# Patient Record
Sex: Female | Born: 1980 | Race: White | Hispanic: No | Marital: Married | Smoking: Never smoker
Health system: Southern US, Community
[De-identification: ages and names within clinical notes are randomized; demographics above are authoritative.]

## PROBLEM LIST (undated history)

## (undated) ENCOUNTER — Inpatient Hospital Stay (HOSPITAL_COMMUNITY): Payer: Self-pay

## (undated) DIAGNOSIS — R011 Cardiac murmur, unspecified: Secondary | ICD-10-CM

## (undated) DIAGNOSIS — T7840XA Allergy, unspecified, initial encounter: Secondary | ICD-10-CM

## (undated) DIAGNOSIS — R079 Chest pain, unspecified: Secondary | ICD-10-CM

## (undated) HISTORY — DX: Allergy, unspecified, initial encounter: T78.40XA

## (undated) HISTORY — PX: OTHER SURGICAL HISTORY: SHX169

## (undated) HISTORY — DX: Chest pain, unspecified: R07.9

## (undated) HISTORY — DX: Cardiac murmur, unspecified: R01.1

---

## 2009-07-06 ENCOUNTER — Ambulatory Visit: Payer: Self-pay | Admitting: Family Medicine

## 2009-07-06 DIAGNOSIS — I499 Cardiac arrhythmia, unspecified: Secondary | ICD-10-CM | POA: Insufficient documentation

## 2009-07-07 ENCOUNTER — Encounter: Payer: Self-pay | Admitting: Family Medicine

## 2009-07-07 ENCOUNTER — Telehealth (INDEPENDENT_AMBULATORY_CARE_PROVIDER_SITE_OTHER): Payer: Self-pay

## 2009-07-11 LAB — CONVERTED CEMR LAB
Albumin: 4.6 g/dL (ref 3.5–5.2)
BUN: 10 mg/dL (ref 6–23)
Basophils Relative: 0 % (ref 0–1)
CO2: 22 meq/L (ref 19–32)
Creatinine, Ser: 0.77 mg/dL (ref 0.40–1.20)
Eosinophils Absolute: 0 10*3/uL (ref 0.0–0.7)
HCT: 40.7 % (ref 36.0–46.0)
Hemoglobin: 13.9 g/dL (ref 12.0–15.0)
Lymphocytes Relative: 23 % (ref 12–46)
Lymphs Abs: 2.1 10*3/uL (ref 0.7–4.0)
Potassium: 3.7 meq/L (ref 3.5–5.3)
RBC: 4.77 M/uL (ref 3.87–5.11)
TSH: 2.193 microintl units/mL (ref 0.350–4.500)
Total Bilirubin: 0.4 mg/dL (ref 0.3–1.2)

## 2009-07-12 ENCOUNTER — Ambulatory Visit: Payer: Self-pay | Admitting: Obstetrics & Gynecology

## 2009-07-12 ENCOUNTER — Ambulatory Visit: Payer: Self-pay | Admitting: Family Medicine

## 2009-08-01 ENCOUNTER — Ambulatory Visit: Payer: Self-pay | Admitting: Family Medicine

## 2009-08-01 DIAGNOSIS — R002 Palpitations: Secondary | ICD-10-CM

## 2009-08-01 DIAGNOSIS — J209 Acute bronchitis, unspecified: Secondary | ICD-10-CM

## 2009-08-10 ENCOUNTER — Encounter: Payer: Self-pay | Admitting: Cardiology

## 2009-08-10 ENCOUNTER — Ambulatory Visit: Payer: Self-pay | Admitting: Cardiology

## 2009-08-10 DIAGNOSIS — R011 Cardiac murmur, unspecified: Secondary | ICD-10-CM

## 2009-08-10 DIAGNOSIS — R079 Chest pain, unspecified: Secondary | ICD-10-CM

## 2009-08-10 HISTORY — DX: Cardiac murmur, unspecified: R01.1

## 2009-08-10 HISTORY — DX: Chest pain, unspecified: R07.9

## 2009-12-14 ENCOUNTER — Ambulatory Visit: Payer: Self-pay | Admitting: Family Medicine

## 2010-05-16 NOTE — Assessment & Plan Note (Signed)
Summary: CHECK BP/TJ  Nurse Visit Pt states she just wanted to have her BP checked. Pt was advsd she would need to establish with PCP to follow her blood pressure.  Vital Signs:  Patient profile:   30 year old female Temp:     99.0 degrees F Pulse rate:   87 / minute Pulse rhythm:   regular BP sitting:   134 / 82  (right arm) Cuff size:   regular  Vitals Entered By: Lajean Saver RN (July 12, 2009 5:33 PM)  Allergies: 1)  ! * Dust

## 2010-05-16 NOTE — Assessment & Plan Note (Signed)
Summary: NOV palpitations/ bronchitis   Vital Signs:  Patient profile:   30 year old female Menstrual status:  regular LMP:     06/14/2009 Height:      69 inches Weight:      155 pounds BMI:     22.97 O2 Sat:      100 % on Room air Temp:     98.2 degrees F oral Pulse rate:   90 / minute BP sitting:   130 / 82  (left arm) Cuff size:   regular  Vitals Entered By: Payton Spark CMA (August 01, 2009 9:44 AM)  O2 Flow:  Room air CC: New to est. C/o heart flutters. Had normal workup at [facility] but GYN heard murmur and referred to Cards.  LMP (date): 06/14/2009     Menstrual Status regular Enter LMP: 06/14/2009   Primary Care Provider:  Seymour Bars DO  CC:  New to est. C/o heart flutters. Had normal workup at [facility] but GYN heard murmur and referred to Cards. .  History of Present Illness: 30 yo WF presents for NOV.  Previously seeen at [facility].  She sees Dr Marice Potter for gyn care.  She started having heart palpitations 07-06-2009. This started suddenly.  She began having palpitations for about an hour on and off.  Never had this before.  Not associated with chest pain, SOB at the time.   She went to [facility] and was sent home with  Klonopin RX.  EKG/ labs and CXR were all normal.  She has been having cold symptoms for about 10 days, now with chest pressure.  Her husband has PNA.  Denies sputum production.      Her flutters have reduced in frequency.  She is taking Sudafed.  She rarely drinks caffeine.  She feels like she has not been under much stress.  She thinks that the Klonopin has not helped and that her palpitations started before she started the sudafed.  She has not had a fever.        Current Medications (verified): 1)  Jolessa 0.15-0.03 Mg Tabs (Levonorgest-Eth Estrad 91-Day) .... Take 1 Tab By Mouth Once Daily 2)  Klonopin 0.5 Mg Tabs (Clonazepam) .... Take 1/2 To 1 Tablet By Mouth Twice A Day As Needed For Palpataions or Anxiety 3)  Zyrtec Allergy 10 Mg Tabs (Cetirizine Hcl) .... Take  1 Tab By Mouth Once Daily 4)  Multivitamins  Tabs (Multiple Vitamin) .... Take 1 Tab By Mouth Once Daily  Allergies (verified): 1)  ! 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  Review of Systems       no fevers/sweats/weakness, unexplained wt loss/gain, no change in vision, no difficulty hearing, ringing in ears, + hay fever/allergies, + CP/discomfort, no palpitations, no breast lump/nipple discharge, + cough/wheeze, no blood in stool, no N/V/D, no nocturia, no leaking urine, no unusual vag bleeding, no vaginal/penile discharge, no muscle/joint pain, no rash, no new/changing mole, no HA, no memory loss, no anxiety, no sleep problem, no depression, no unexplained lumps, no easy bruising/bleeding   Physical Exam  General:  alert, well-developed, well-nourished, and well-hydrated.   Head:  normocephalic and atraumatic.   Eyes:  conjunctiva clear Nose:  no nasal discharge.   Mouth:  good dentition and pharynx pink and moist.  throat mildly injected  Neck:  no masses.   Lungs:  Normal respiratory effort, chest expands symmetrically. Lungs are clear to auscultation, no crackles or wheezes. Heart:  Normal rate and regular rhythm. S1 and S2 normal without gallop, +1/6 vibratory flow murmur over the LLSB, no  click, rub or other extra sounds. Abdomen:  soft, non-tender, normal bowel sounds, no distention, no masses, no guarding, no hepatomegaly, and no splenomegaly.   Extremities:  no LE edema Skin:  color normal.   Cervical Nodes:  No lymphadenopathy noted Psych:  flat affect.     Impression & Recommendations:  Problem # 1:  PALPITATIONS (ICD-785.1) Reviewed UC w/u including labs, EKG and CXR = all normal. Her palpitations have decreased in frequency, maybe due to use of Klonopin. Normal exam  findings, BP, HR today.   She can keep her appt with cardiology to follow up. For now, she is to STOP any caffeine and all sudafed use.    Problem # 2:  ACUTE BRONCHITIS (ICD-466.0) Will go ahead and treat her given her symptoms of chest tightness and expsosure to husband who has PNA at home.  Take 5 days of Zithromax with PLAIN Mucinex.   Her updated medication list for this problem includes:    Zithromax Z-pak 250 Mg Tabs (Azithromycin) .Marland Kitchen... 2 tabs by mouth x 1 day then 1 tab by mouth daily x 4 days    Mucinex 600 Mg Xr12h-tab (Guaifenesin) .Marland Kitchen... 1 tab by mouth q 12 hrs  Complete Medication List: 1)  Jolessa 0.15-0.03 Mg Tabs (Levonorgest-eth estrad 91-day) .... Take 1 tab by mouth once daily 2)  Klonopin 0.5 Mg Tabs (Clonazepam) .... Take 1/2 to 1 tablet by mouth twice a day as needed for palpataions or anxiety 3)  Zyrtec Allergy 10 Mg Tabs (Cetirizine hcl) .... Take 1 tab by mouth once daily 4)  Multivitamins Tabs (Multiple vitamin) .... Take 1 tab by mouth once daily 5)  Zithromax Z-pak 250 Mg Tabs (Azithromycin) .... 2 tabs by mouth x 1 day then 1 tab by mouth daily x 4 days 6)  Mucinex 600 Mg Xr12h-tab (Guaifenesin) .Marland Kitchen.. 1 tab by mouth q 12 hrs  Patient Instructions: 1)  Labs / CXR/ EKG reviewed.  All normal from UC. 2)  Start Zithromax for bronchitis.  Take for 5 days. 3)  Avoid CAFFEINE AND SUDAFED. 4)  Use Klonopin as needed for anxiety. 5)  F/U palpitations with Dr Jens Som. Prescriptions: ZITHROMAX Z-PAK 250 MG TABS (AZITHROMYCIN) 2 tabs by mouth x 1 day then 1 tab by mouth daily x 4 days  #1 pack x 0   Entered and Authorized by:   Seymour Bars DO   Signed by:   Seymour Bars DO on 08/01/2009   Method used:   Electronically to        [facility] [address] [phone]* (retail)       [address]       [place], [place]  [address]       Ph: [phone] or [phone]       Fax: [phone]   RxID:   [id number]

## 2010-05-16 NOTE — Assessment & Plan Note (Signed)
Summary: HEART FLUTTERING   Vital Signs:  Patient Profile:   30 Years Old Female CC:      Weird fluttering in chest x 3 days Height:     69 inches Weight:      160 pounds O2 Sat:      100 % O2 treatment:    Room Air Temp:     99.0 degrees F oral Pulse rate:   90 / minute Pulse rhythm:   regular Resp:     12 per minute BP sitting:   133 / 81  (right arm) Cuff size:   regular  Vitals Entered By: Emilio Math (July 06, 2009 4:02 PM)                  Prior Medication List:  No prior medications documented  Current Allergies (reviewed today): ! * DUSTHistory of Present Illness Chief Complaint: Weird fluttering in chest x 3 days History of Present Illness: Patient has had an unusual fluttering ssensation in her chest for 3 days. 2 YEARS AGO HAD CHEST PAIN AND WAS TREATED FOR AN ANXIETY ATTACK BUT THE PAIN IN HER CHEST WAS DIFFERENT THAN THIS DISCOMFORT.  sHE DID TAKE A MUSCLE RELAXER 0N MONDAY. IT DID GO AWAY FOR A WHILE BUTB LAST NIGHT SHE NOTICED IT AND TODAY EVEN WHEN SHE IS NOT STILL.  it IS NOT CONSTANT AND IT COMES AND GOES. SHE DENIES HIGH CAFINE DOSAGES.   Current Problems: ANXIETY (ICD-300.00) UNSPECIFIED CARDIAC DYSRHYTHMIA (ICD-427.9)   Current Meds JOLESSA 0.15-0.03 MG TABS (LEVONORGEST-ETH ESTRAD 91-DAY)  KLONOPIN 0.5 MG TABS (CLONAZEPAM) sig 1/2 to 1 tablet by mouth twice a day as needed for palpataions or anxiety  REVIEW OF SYSTEMS Constitutional Symptoms      Denies fever, chills, night sweats, weight loss, weight gain, and fatigue.  Eyes       Denies change in vision, eye pain, eye discharge, glasses, contact lenses, and eye surgery. Ear/Nose/Throat/Mouth       Denies hearing loss/aids, change in hearing, ear pain, ear discharge, dizziness, frequent runny nose, frequent nose bleeds, sinus problems, sore throat, hoarseness, and tooth pain or bleeding.  Respiratory       Denies dry cough, productive cough, wheezing, shortness of breath, asthma, bronchitis,  and emphysema/COPD.  Cardiovascular       Denies murmurs, chest pain, and tires easily with exhertion.      Comments: NO CHEST P[AIN   Gastrointestinal       Denies stomach pain, nausea/vomiting, diarrhea, constipation, blood in bowel movements, and indigestion. Genitourniary       Denies painful urination, kidney stones, and loss of urinary control. Neurological       Denies paralysis, seizures, and fainting/blackouts. Musculoskeletal       Denies muscle pain, joint pain, joint stiffness, decreased range of motion, redness, swelling, muscle weakness, and gout.  Skin       Denies bruising, unusual mles/lumps or sores, and hair/skin or nail changes.  Psych       Denies mood changes, temper/anger issues, anxiety/stress, speech problems, depression, and sleep problems. Other Comments: Irregular heart beat x 3 days SHE DECRIBES AS SKIPPING BEATS .   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 Mother, Healthy father, Healthy Brother,  Irregular heart beat  Social History: Reviewed history and no changes required. 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 Physical Exam General appearance: well developed, well nourished, no acute distress Head: normocephalic, atraumatic Oral/Pharynx: tongue normal, posterior pharynx without erythema or exudate Chest/Lungs: no rales, wheezes, or rhonchi bilateral, breath sounds equal without effort Heart: regular rate and  rhythm, W.woccasional skipped beatsno murmur Abdomen: soft, non-tender without obvious organomegaly Skin: no obvious rashes or lesions MSE: oriented to time, place, and person Assessment Problems:   New  Problems: ANXIETY (ICD-300.00) UNSPECIFIED CARDIAC DYSRHYTHMIA (ICD-427.9)  anxiety or arrthymia  Plan New Medications/Changes: KLONOPIN 0.5 MG TABS (CLONAZEPAM) sig 1/2 to 1 tablet by mouth twice a day as needed for palpataions or anxiety  #20 x 0, 07/06/2009, Hassan Rowan MD  New Orders: T-Chest x-ray, 2 views [71020] T-CBC w/Diff [[id number]] T-Comprehensive Metabolic Panel [[id number]] T-TSH [[id number]] T-D-Dimer Fibrin Derivatives Quantitive [[id number]] New Patient Level IV [28413] Planning Comments:   need to establish a PCP  Follow Up: Follow up on an as needed basis, Follow up with Primary Physician  The patient and/or caregiver has been counseled thoroughly with regard to medications prescribed including dosage, schedule, interactions, rationale for use, and possible side effects and they verbalize understanding.  Diagnoses and expected course of recovery discussed and will return if not improved as expected or if the condition worsens. Patient and/or caregiver verbalized understanding.  Prescriptions: KLONOPIN 0.5 MG TABS (CLONAZEPAM) sig 1/2 to 1 tablet by mouth twice a day as needed for palpataions or anxiety  #20 x 0   Entered and Authorized by:   Hassan Rowan MD   Signed by:   Hassan Rowan MD on 07/06/2009   Method used:   Print then Give to Patient   RxID:   [id number]   Patient Instructions: 1)  Use the klonopin only if feeling palpatations or anxiety 2)  As discussed option of seeing Cardiologist if things get worse for echo, stress test or holtermonitoir 3)  Please schedule a follow-up appointment as needed. 4)  Please schedule an appointment with your primary doctor in :3-5 days if not better  5)  estabish a PCP

## 2010-05-16 NOTE — Assessment & Plan Note (Signed)
Summary: Tb Skin Test - jr  Nurse Visit   Vitals Entered By: Payton Spark CMA (December 14, 2009 4:09 PM)  Allergies: 1)  ! * Dust  Immunizations Administered:  PPD Skin Test:    Vaccine Type: PPD    Site: left forearm    Dose: 0.1 ml    Route: ID    Given by: Payton Spark CMA  Orders Added: 1)  TB Skin Test [86580] 2)  Admin 1st Vaccine [16109]

## 2010-05-16 NOTE — Assessment & Plan Note (Signed)
Summary: [facility]   Visit Type:  Initial Consult Primary Provider:  Seymour Bars DO  CC:  palpitations.  History of Present Illness: 30 year old female for evaluation of palpitations. No prior cardiac history. Note TSH normal in March of 2011. Nprior cardiac history. She typically does not have dyspnea on exertion, orthopnea, PND, pedal edema, syncope or exertional chest pain. Over the past month she has had occasional palpitations. They're described as a skipp and flutter. There is no associated symptoms. They're not related to exertion. She was seen by her primary care physician and she was told to discontinue her Sudafed and her symptoms have improve. She also recently had bronchitis and had chest tightness during that period. It increased with lying flat. There is no associated nausea, short of breath or diaphoresis. She was treated and her symptoms have now resolved. Because of the above we were asked to further evaluate. She was also noted to have a murmur.  Current Medications (verified): 1)  Jolessa 0.15-0.03 Mg Tabs (Levonorgest-Eth Estrad 91-Day) .... Take 1 Tab By Mouth Once Daily 2)  Zyrtec Allergy 10 Mg Tabs (Cetirizine Hcl) .... Take 1 Tab By Mouth Once Daily 3)  Multivitamins  Tabs (Multiple Vitamin) .... Take 1 Tab By Mouth Once Daily  Allergies: 1)  ! * Dust  Past History:  Past Medical History: No significant PMH  Past Surgical History: Reviewed history from 08/01/2009 and no changes required. wisdom teeth removal  Family History: Reviewed history from 07/06/2009 and no changes required. Mother, Healthy father, Healthy Brother, Irregular heart beat  Social History: Reviewed history from 08/01/2009 and no changes required. 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  No kids.  Review of Systems       Recent bronchitis now improved but no fevers or chills, productive cough, hemoptysis, dysphasia, odynophagia,  melena, hematochezia, dysuria, hematuria, rash, seizure activity, orthopnea, PND, pedal edema, claudication. Remaining systems are negative.   Vital Signs:  Patient profile:   30 year old female Menstrual status:  regular Height:      69 inches Weight:      156.75 pounds BMI:     23.23 Pulse rate:   82 / minute Pulse rhythm:   regular Resp:     18 per minute BP sitting:   122 / 70  (left arm) Cuff size:   large  Vitals Entered By: Vikki Ports (August 10, 2009 10:31 AM)  Physical Exam  General:  Well developed/well nourished in NAD Skin warm/dry Patient not depressed No peripheral clubbing Back-normal HEENT-normal/normal eyelids Neck supple/normal carotid upstroke bilaterally; no bruits; no JVD; no thyromegaly chest - CTA/ normal expansion CV - RRR/normal S1 and S2; no  rubs or gallops;  PMI nondisplaced, 1-2/6 systolic ejection murmur. Abdomen -NT/ND, no HSM, no mass, + bowel sounds, no bruit 2+ femoral pulses, no bruits Ext-no edema, chords, 2+ DP Neuro-grossly nonfocal     EKG  Procedure date:  08/10/2009  Findings:      Normal sinus rhythm at a rate of 82. Axis normal. No ST changes. No delta wave noted.  Impression & Recommendations:  Problem # 1:  PALPITATIONS (ICD-785.1) Symptoms sound to be PVCs. They have improved now that Sudafed has been discontinued. I will not pursue this further unless they return. If so we could then consider a CardioNet monitor. Orders: Echocardiogram (Echo)  Problem # 2:  CHEST PAIN (ICD-786.50) Symptoms most likely related to bronchitis. No exertional chest pain and electrocardiogram  normal. No further evaluation.  Problem # 3:  CARDIAC MURMUR (ICD-785.2) Probable ejection murmur. Will schedule echocardiogram both for murmur and palpitations.  Patient Instructions: 1)  Your physician recommends that you schedule a follow-up appointment in: AS NEEDED PENDING TEST RESULTS 2)  Your physician has requested that you have an  echocardiogram.  Echocardiography is a painless test that uses sound waves to create images of your heart. It provides your doctor with information about the size and shape of your heart and how well your heart's chambers and valves are working.  This procedure takes approximately one hour. There are no restrictions for this procedure.

## 2010-05-16 NOTE — Letter (Signed)
Summary: Internal Correspondence  Internal Correspondence   Imported By: Shelbie Proctor 07/06/2009 19:39:20  _____________________________________________________________________  External Attachment:    Type:   Image     Comment:   External Document

## 2010-05-16 NOTE — Letter (Signed)
Summary: Depression Questionnaire/[place] Mallory Rich  Depression Questionnaire/[place] Mallory Rich   Imported By: Lanelle Bal 08/08/2009 08:58:18  _____________________________________________________________________  External Attachment:    Type:   Image     Comment:   External Document

## 2010-05-16 NOTE — Miscellaneous (Signed)
Summary: Vaccine Record  Vaccine Record   Imported By: Mallory Rich 12/27/2009 11:24:45  _____________________________________________________________________  External Attachment:    Type:   Image     Comment:   External Document

## 2010-05-16 NOTE — Progress Notes (Signed)
  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 Clld pt - advise of need to have her lab redrawn. Pt stated that she would come in today by 6:30 or tomorrow 07/08/09 to have lab redrawn.     Initial call taken by: Areta Haber CMA,  July 07, 2009 2:36 PM

## 2010-07-28 ENCOUNTER — Encounter: Payer: Self-pay | Admitting: Family Medicine

## 2010-08-02 ENCOUNTER — Encounter: Payer: Self-pay | Admitting: Family Medicine

## 2010-08-02 ENCOUNTER — Ambulatory Visit (INDEPENDENT_AMBULATORY_CARE_PROVIDER_SITE_OTHER): Payer: BLUE CROSS/BLUE SHIELD | Admitting: Family Medicine

## 2010-08-02 VITALS — BP 135/80 | HR 87 | Ht 69.0 in | Wt 165.0 lb

## 2010-08-02 DIAGNOSIS — L918 Other hypertrophic disorders of the skin: Secondary | ICD-10-CM

## 2010-08-02 DIAGNOSIS — D229 Melanocytic nevi, unspecified: Secondary | ICD-10-CM

## 2010-08-02 DIAGNOSIS — D239 Other benign neoplasm of skin, unspecified: Secondary | ICD-10-CM

## 2010-08-02 DIAGNOSIS — L909 Atrophic disorder of skin, unspecified: Secondary | ICD-10-CM

## 2010-08-02 DIAGNOSIS — L259 Unspecified contact dermatitis, unspecified cause: Secondary | ICD-10-CM

## 2010-08-02 DIAGNOSIS — L309 Dermatitis, unspecified: Secondary | ICD-10-CM

## 2010-08-02 MED ORDER — TRIAMCINOLONE ACETONIDE 0.1 % EX OINT: TOPICAL_OINTMENT | Freq: Two times a day (BID) | CUTANEOUS | Status: DC

## 2010-08-02 NOTE — Progress Notes (Signed)
  Subjective:    Patient ID: Mallory Rich, female    DOB: [birth year], 30 y.o.   MRN: [id number]  HPI 30 yo WF presents for mole check and a small rash on her ring finger x 10 days with some itching.  Not using anything topically and keeping ring off.  She think s that some of her moles are changing on her chest and her upper back.  Has never seen derm.  Has fam hx of melanoma.  Denies pain or bleeding from any of them.  She has some skin tags that she would like frozen today.   BP 135/80  Pulse 87  Ht 5\' 9"  (1.753 m)  Wt 165 lb (74.844 kg)  BMI 24.37 kg/m2  SpO2 99%  LMP 07/12/2010     Review of Systems  Constitutional: Negative for fever and chills.  Skin: Positive for rash.       Objective:   Physical Exam  Constitutional: She appears well-developed and well-nourished. No distress.  HENT:  Head: Normocephalic and atraumatic.  Pulmonary/Chest: Effort normal and breath sounds normal.  Musculoskeletal:       ASO on L ankle, on crutches for ankle sprain  Lymphadenopathy:    She has no cervical adenopathy.  Skin: Skin is warm and dry. Rash (small patch of lichenified skin under ring L hand) noted.          Black lesions - all moles, < 1cm diameter but some most are speckled.  Pink lesions- skin tags, rub on collar and bra strap  Psychiatric: She has a normal mood and affect.          Assessment & Plan:  Atypical appearing moles- due to so many changing, speckled moles on chest and upper  Back and a fam hx of melanoma, I'd like her to see dermatology.  Referral made to Dr Vernell Barrier.  Irritated skin tags - cleaned with alcohol swabs and treated with liquid nitrogen today.  Pt tolerated well.  Local skin care measures discussed.  Rash on finger, dermatitis- will treat with triamcinolone ointment x 10 days.

## 2010-08-02 NOTE — Patient Instructions (Signed)
For frozen skin tags, keep clean with soap and water, cover with neosporin and a bandaid if needed.  They should fall off in a couple days.  Referral made to derm for moles.  Use RX ointment for rash on finger x 10 days.

## 2010-08-04 ENCOUNTER — Encounter: Payer: Self-pay | Admitting: Family Medicine

## 2010-08-07 ENCOUNTER — Encounter: Payer: Self-pay | Admitting: Family Medicine

## 2010-08-07 ENCOUNTER — Ambulatory Visit (INDEPENDENT_AMBULATORY_CARE_PROVIDER_SITE_OTHER): Payer: BLUE CROSS/BLUE SHIELD | Admitting: Family Medicine

## 2010-08-07 VITALS — BP 133/74 | HR 87 | Ht 69.0 in | Wt 165.0 lb

## 2010-08-07 DIAGNOSIS — L919 Hypertrophic disorder of the skin, unspecified: Secondary | ICD-10-CM

## 2010-08-07 DIAGNOSIS — L918 Other hypertrophic disorders of the skin: Secondary | ICD-10-CM

## 2010-08-07 NOTE — Progress Notes (Signed)
  Subjective:    Patient ID: Mallory Rich, female    DOB: May 24, 1980, 29 y.o.   MRN: [id number]  HPI  30 yo WF presents for me to recheck a skin lesion that received liquid nitrogen last wk.  It was an irritated skin tag over the L anterior shoulder (under bra strap) that was cleaned first with alcohol and treated.  The one on the R side of her neck did fall off.  The one on the L shoulder became red and a little swollen with little pain.  No bleeding or drainage.  She is seeing derm for multiple atypical nevi.  BP 133/74  Pulse 87  Ht 5\' 9"  (1.753 m)  Wt 165 lb (74.844 kg)  BMI 24.37 kg/m2  SpO2 100%  LMP 07/12/2010     Review of Systems no fevers, skin discharge.  Resolving rash on ring finger.     Objective:   Physical Exam  Skin:          L anterior shoulder skin tap s/p cryo appears to have slight hyperemia, no underlying edema.  Stalk still intact.  No bleeding or drainage.         Assessment & Plan:  Irritated skin tag s/p cryo with localized hyperemia.  No sign of skin infection.  Will have her clean with soap and water 2 x a day and cover with triple abx ointment and a bandage.  If lesion does not fall off, she can have derm clip this.  She was not charged for cryo last wk.

## 2010-08-07 NOTE — Patient Instructions (Signed)
Clean frozen skin tag with soap and water twice daily.  Cover with antibiotic ointment and a bandage.    If it doesn't fall off on it's own, just leave it for dermatologist to clip.

## 2010-08-29 NOTE — Assessment & Plan Note (Signed)
NAMEJODY, Mallory Rich               ACCOUNT NO.:  [id number]   MEDICAL RECORD NO.:  [id number]          PATIENT TYPE:  POB   LOCATION:  [facility] at [place]         FACILITY:  [facility]   PHYSICIAN:  Allie Bossier, MD        DATE OF BIRTH:  November 10, 1980   DATE OF SERVICE:  07/12/2009                                  CLINIC NOTE   Ms. Pantaleon is a 30 year old married white gravida 0 here as a new  patient.  She comes in for annual exam.  Today, she is requesting that  she get a refill on her birth control pills Marcille Blanco) and she would  likely them in generic form.  She also mentions that she was recently  evaluated at Urgent Care for new-onset chest palpitations and was given  a prescription for anxiety medicines.  She does not know what medicine  is that, she has not started taking it yet.   PAST MEDICAL HISTORY:  She did have one panic attack in the distant  past.   PAST SURGICAL HISTORY:  She had a wisdom teeth extraction and no  difficulty with anesthetic.   REVIEW OF SYSTEMS:  Her TSH was checked and it was recently normal.  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  She has been married since July  2010 and this has been her only sexual partner.  She reports all normal  Pap smears in the past.   SOCIAL HISTORY:  Negative for tobacco, alcohol, or drug use.   FAMILY HISTORY:  Negative for breast, GYN, and colon malignancies.   ALLERGIES:  No latex allergies.  No known drug allergies.   MEDICATIONS:  Marcille Blanco daily, multivitamin on a plus/minus basis, Zyrtec  on a p.r.n. basis, vitamin E, and ibuprofen as necessary.   PHYSICAL EXAMINATION:  GENERAL:  Well-nourished, well-hydrated pleasant  white female in no apparent distress.  VITAL SIGNS:  Height 5 feet 9 inches, weight 159, blood pressure 148/88,  pulse 104.  HEENT:  Normal.  HEART:  Regular rate and rhythm with a 4/6 systolic ejection murmur at  the lower left sternal border (she denies a history of being told that  she has a  murmur).  ABDOMEN:  Benign.  No palpable hepatosplenomegaly.  EXTERNAL GENITALIA:  No lesions.  Cervix nulliparous.  Small ectropion  noted.  Pap smear obtained.  Uterus, on bimanual, is normal size and  shape, anteverted, mobile.  Adnexa nontender.  No masses.   ASSESSMENT/PLAN:  1. Annual exam.  We checked Pap smear, recommended self-breast and      self vulvar exams.  2. New onset murmur and palpitations.  In spite of her diagnosis of      anxiety, I feel that new onset murmur and      palpitations deserve the cardiology consult.  Therefore, I will      refer her to a cardiologist.  I have refilled her Marcille Blanco and I      have written it for generic.      Allie Bossier, MD     MCD/MEDQ  D:  07/12/2009  T:  07/13/2009  Job:  [id number]

## 2010-10-11 ENCOUNTER — Other Ambulatory Visit: Payer: Self-pay | Admitting: Obstetrics & Gynecology

## 2010-10-11 ENCOUNTER — Ambulatory Visit (INDEPENDENT_AMBULATORY_CARE_PROVIDER_SITE_OTHER): Payer: BC Managed Care – PPO | Admitting: Obstetrics & Gynecology

## 2010-10-11 DIAGNOSIS — Z01419 Encounter for gynecological examination (general) (routine) without abnormal findings: Secondary | ICD-10-CM

## 2010-10-11 DIAGNOSIS — Z1272 Encounter for screening for malignant neoplasm of vagina: Secondary | ICD-10-CM

## 2010-10-11 DIAGNOSIS — Z113 Encounter for screening for infections with a predominantly sexual mode of transmission: Secondary | ICD-10-CM

## 2010-10-27 NOTE — Assessment & Plan Note (Signed)
Mallory Rich, Mallory Rich               ACCOUNT NO.:  [id number]  MEDICAL RECORD NO.:  [id number]          PATIENT TYPE:  LOCATION:  [facility] [place]           FACILITY:  PHYSICIAN:  Allie Bossier, MD             DATE OF BIRTH:  DATE OF SERVICE:  10/11/2010                                 CLINIC NOTE  Mallory Rich is a 30 year old married white gravida 0 who is here for annual exam.  She has no particular complaints.  She would like a refill on her Alesse which gives 4 periods a year.  On review of systems, she reports occasional dyspareunia, but usually it is positional and it is rare.  Review of systems, she did see the cardiologist last year and they reassured her about her murmur and decided that her palpitations were related to her Sudafed use.  Of note, her anxiety has decreased since she quit using Sudafed.  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  She had been married since July 2010, and all her Pap smears have been normal.  She is only had one sexual partner in her lifetime.  PAST MEDICAL HISTORY:  She had a panic attack in the distant past.  PAST SURGICAL HISTORY:  Wisdom teeth extraction.  SOCIAL HISTORY:  Negative for tobacco, alcohol, drug use.  FAMILY HISTORY:  Negative for breast, GYN and colon malignancies.  No latex allergies.  MEDICATIONS:  She takes Alesse OCP daily, multivitamin on a plus/minus basis.  She is taking Zyrtec daily, now vitamin E and ibuprofen.  PHYSICAL EXAMINATION:  GENERAL:  A well-nourished, well-hydrated pleasant white female.  Height 5 feet 9 inches, weight 163 pounds, blood pressure 132/79, pulse 86. HEENT:  Normal. HEART:  Regular rate and rhythm.  She does have a 2/6 systolic ejection murmur in the supine position at the lower sternal border. ABDOMEN:  Benign.  No palpable hepatosplenomegaly. EXTERNAL GENITALIA:  No lesions.  Vagina normal discharge.  Nulliparous cervix.  Bimanual  exam, her uterus is normal in size and shape, anteverted mobile.  Adnexa, nontender and no masses.  ASSESSMENT/PLAN: 1. Annual exam.  I have checked Pap smear.  Recommended self-breast     and self-vulvar exams. 2. General health maintenance, I am checking fasting lipids, TSH and     sugar today.  I have refilled her Alesse as requested and I have     mentioned to her that she does not have to take any of the inactive     pills unless she wants     to do.  Told her that when she has breakthrough bleeding on     occasion she can take an extra active pill from the end of her     pack.     Allie Bossier, MD    MCD/MEDQ  D:  10/11/2010  T:  10/12/2010  Job:  [id number]

## 2010-11-27 ENCOUNTER — Encounter: Payer: Self-pay | Admitting: Cardiology

## 2011-05-24 ENCOUNTER — Encounter: Payer: Self-pay | Admitting: Family Medicine

## 2011-05-24 ENCOUNTER — Ambulatory Visit (INDEPENDENT_AMBULATORY_CARE_PROVIDER_SITE_OTHER): Payer: BC Managed Care – PPO | Admitting: Family Medicine

## 2011-05-24 VITALS — BP 146/80 | HR 81 | Temp 98.3°F | Ht 69.0 in | Wt 163.0 lb

## 2011-05-24 DIAGNOSIS — Z9109 Other allergy status, other than to drugs and biological substances: Secondary | ICD-10-CM

## 2011-05-24 DIAGNOSIS — R51 Headache: Secondary | ICD-10-CM

## 2011-05-24 DIAGNOSIS — J0100 Acute maxillary sinusitis, unspecified: Secondary | ICD-10-CM

## 2011-05-24 DIAGNOSIS — J309 Allergic rhinitis, unspecified: Secondary | ICD-10-CM

## 2011-05-24 MED ORDER — FLUTICASONE PROPIONATE 50 MCG/ACT NA SUSP: 2.0000 | Freq: Every day | NASAL | Status: DC

## 2011-05-24 MED ORDER — AMOXICILLIN-POT CLAVULANATE 875-125 MG PO TABS: 1.0000 | ORAL_TABLET | Freq: Two times a day (BID) | ORAL | Status: AC

## 2011-05-24 MED ORDER — MONTELUKAST SODIUM 10 MG PO TABS: 10.0000 mg | ORAL_TABLET | Freq: Every day | ORAL | Status: DC

## 2011-05-24 NOTE — Patient Instructions (Signed)
Allergic Rhinitis Allergic rhinitis is when the mucous membranes in the nose respond to allergens. Allergens are particles in the air that cause your body to have an allergic reaction. This causes you to release allergic antibodies. Through a chain of events, these eventually cause you to release histamine into the blood stream (hence the use of antihistamines). Although meant to be protective to the body, it is this release that causes your discomfort, such as frequent sneezing, congestion and an itchy runny nose.  CAUSES  The pollen allergens may come from grasses, trees, and weeds. This is seasonal allergic rhinitis, or "hay fever." Other allergens cause year-round allergic rhinitis (perennial allergic rhinitis) such as house dust mite allergen, pet dander and mold spores.  SYMPTOMS   Nasal stuffiness (congestion).   Runny, itchy nose with sneezing and tearing of the eyes.   There is often an itching of the mouth, eyes and ears.  It cannot be cured, but it can be controlled with medications. DIAGNOSIS  If you are unable to determine the offending allergen, skin or blood testing may find it. TREATMENT   Avoid the allergen.   Medications and allergy shots (immunotherapy) can help.   Hay fever may often be treated with antihistamines in pill or nasal spray forms. Antihistamines block the effects of histamine. There are over-the-counter medicines that may help with nasal congestion and swelling around the eyes. Check with your caregiver before taking or giving this medicine.  If the treatment above does not work, there are many new medications your caregiver can prescribe. Stronger medications may be used if initial measures are ineffective. Desensitizing injections can be used if medications and avoidance fails. Desensitization is when a patient is given ongoing shots until the body becomes less sensitive to the allergen. Make sure you follow up with your caregiver if problems continue. SEEK  MEDICAL CARE IF:   You develop fever (more than 100.5 F (38.1 C).   You develop a cough that does not stop easily (persistent).   You have shortness of breath.   You start wheezing.   Symptoms interfere with normal daily activities.  Document Released: 12/26/2000 Document Revised: 12/13/2010 Document Reviewed: 07/07/2008 Exit[employer] Patient Information 2012 [employer].  Sinusitis Sinuses are air pockets within the bones of your face. The growth of bacteria within a sinus leads to infection. The infection prevents the sinuses from draining. This infection is called sinusitis. SYMPTOMS  There will be different areas of pain depending on which sinuses have become infected.  The maxillary sinuses often produce pain beneath the eyes.   Frontal sinusitis may cause pain in the middle of the forehead and above the eyes.  Other problems (symptoms) include:  Toothaches.   Colored, pus-like (purulent) drainage from the nose.   Swelling, warmth, and tenderness over the sinus areas may be signs of infection.  TREATMENT  Sinusitis is most often determined by an exam.X-rays may be taken. If x-rays have been taken, make sure you obtain your results or find out how you are to obtain them. Your caregiver may give you medications (antibiotics). These are medications that will help kill the bacteria causing the infection. You may also be given a medication (decongestant) that helps to reduce sinus swelling.  HOME CARE INSTRUCTIONS   Only take over-the-counter or prescription medicines for pain, discomfort, or fever as directed by your caregiver.   Drink extra fluids. Fluids help thin the mucus so your sinuses can drain more easily.   Applying either moist heat or   ice packs to the sinus areas may help relieve discomfort.   Use saline nasal sprays to help moisten your sinuses. The sprays can be found at your local drugstore.  SEEK IMMEDIATE MEDICAL CARE IF:  You have a fever.   You have  increasing pain, severe headaches, or toothache.   You have nausea, vomiting, or drowsiness.   You develop unusual swelling around the face or trouble seeing.  MAKE SURE YOU:   Understand these instructions.   Will watch your condition.   Will get help right away if you are not doing well or get worse.  Document Released: 04/02/2005 Document Revised: 12/13/2010 Document Reviewed: 10/30/2006 ExitCare Patient Information 2012 [employer]. 

## 2011-05-24 NOTE — Progress Notes (Signed)
  Subjective:    Patient ID: Mallory Rich, female    DOB: [birth year], 31 y.o.   MRN: [id number]  Headache  This is a new problem. The current episode started today (woke up w/the headach). The problem occurs constantly. The problem has been gradually improving. The pain is located in the bilateral (behinds both eyes) region. The pain does not radiate. The pain quality is not similar to prior headaches. The quality of the pain is described as aching. Associated symptoms include dizziness, ear pain, eye pain, facial sweating, phonophobia and tinnitus. Pertinent negatives include no abdominal pain, anorexia, coughing, drainage, fever, loss of balance, muscle aches, nausea, photophobia, rhinorrhea, visual change or vomiting. Associated symptoms comments: Joint pain. The symptoms are aggravated by noise. She has tried NSAIDs for the symptoms. The treatment provided mild relief. Her past medical history is significant for migraine headaches and migraines in the family. (Allergies)      Review of Systems  Constitutional: Negative for fever.  HENT: Positive for ear pain and tinnitus. Negative for rhinorrhea.   Eyes: Positive for pain. Negative for photophobia.  Respiratory: Negative for cough.   Gastrointestinal: Negative for nausea, vomiting, abdominal pain and anorexia.  Neurological: Positive for dizziness and headaches. Negative for loss of balance.  All other systems reviewed and are negative.   BP 146/80  Pulse 81  Temp(Src) 98.3 F (36.8 C) (Oral)  Ht 5\' 9"  (1.753 m)  Wt 163 lb (73.936 kg)  BMI 24.07 kg/m2  SpO2 100%    Objective:   Physical Exam  Constitutional: She is oriented to Rich, place, and time. She appears well-developed and well-nourished.  HENT:  Head: Normocephalic.  Right Ear: External ear and ear canal normal.  Left Ear: External ear and ear canal normal.  Nose: Rhinorrhea present. Left sinus exhibits maxillary sinus tenderness.       ALLERGIC SHINERS ON BOTH SIDE  W/INCREASE NASAL CONGESTION ON THE R  Eyes: Pupils are equal, round, and reactive to light.  Neck: Normal range of motion. Neck supple.       No occipital tenderness Mild R TMJ tenderness  Lymphadenopathy:    She has cervical adenopathy.       Right cervical: Superficial cervical adenopathy present.       Left cervical: Superficial cervical adenopathy present.  Neurological: She is alert and oriented to Rich, place, and time. She has normal reflexes.  Skin: Skin is warm and dry.  Psychiatric: She has a normal mood and affect. Her behavior is normal.          Assessment & Plan:  Sinusitis, allergies, headache  Augmentin , Singulair  and Flonase  Return as schedule

## 2011-07-02 ENCOUNTER — Ambulatory Visit (INDEPENDENT_AMBULATORY_CARE_PROVIDER_SITE_OTHER): Payer: BC Managed Care – PPO | Admitting: Physician Assistant

## 2011-07-02 ENCOUNTER — Encounter: Payer: Self-pay | Admitting: Physician Assistant

## 2011-07-02 VITALS — BP 134/80 | HR 82 | Temp 98.4°F | Ht 69.0 in | Wt 165.0 lb

## 2011-07-02 DIAGNOSIS — Z9109 Other allergy status, other than to drugs and biological substances: Secondary | ICD-10-CM

## 2011-07-02 DIAGNOSIS — J3489 Other specified disorders of nose and nasal sinuses: Secondary | ICD-10-CM

## 2011-07-02 DIAGNOSIS — R51 Headache: Secondary | ICD-10-CM

## 2011-07-02 MED ORDER — METHYLPREDNISOLONE 4 MG PO KIT: PACK | ORAL | Status: AC

## 2011-07-02 NOTE — Patient Instructions (Signed)
Will give Omnaris samples to use instead of flonase for nasal congestion and sinus pressure. Sent steroid pack to pharmacy to use. Call in two weeks if not improving. Referral will be sent to Allergist for allergy testing. Will consider CT of sinuses if does not resolve.

## 2011-07-02 NOTE — Progress Notes (Signed)
  Subjective:    Patient ID: Mallory Rich, female    DOB: January 09, 1981, 31 y.o.   MRN: [id number]  HPI Patient presents to clinic for daily headaches behind eyes and sinus pressure. Patient was seen 05/24/11 by Dr. Thurmond Butts and given Augmentin, Flonase, and Singulair. She was not able to pick up singulair because pharmacy states she did not have asthma and would not approve. Flonase made her head hurt worse after she used it and has not used it since. She has finished her Augmentin and continues to stay on Zyrtec daily. Her symptoms of ringing in ears, sore throat, and cold like symptoms have resolved. She does still has a dull headache (4/10) every morning when she wakes up behind eyes and the middle of forehead the headache usually resolves by lunch with or without treatment. She denies any nausea, vomiting, vision changes. She takes medication at night to help with this. For over a year she has had worsening problems with her allergies. We discussed that the only changes that she has made is a move into an older home. It has been freshly painted but yellow mold does grow on baseboards. She denies fever, wheezing, SOB, or any skin changes.     Review of Systems     Objective:   Physical Exam  Constitutional: She is oriented to Rich, place, and time. She appears well-developed and well-nourished.  HENT:  Head: Normocephalic and atraumatic.  Right Ear: External ear normal.  Left Ear: External ear normal.  Mouth/Throat: Oropharynx is clear and moist. No oropharyngeal exudate.       TM's normal. NO maxillary tenderness today. Bilateral turbinates red and swollen.   Eyes: Conjunctivae are normal.       Allergic shiners on under bilateral eyes.   Neck: Normal range of motion. Neck supple.  Cardiovascular: Normal rate, regular rhythm and normal heart sounds.   Pulmonary/Chest: Effort normal and breath sounds normal. She has no wheezes.  Lymphadenopathy:    She has no cervical adenopathy.   Neurological: She is alert and oriented to Rich, place, and time.  Skin: Skin is warm and dry. No rash noted.  Psychiatric: She has a normal mood and affect. Her behavior is normal.          Assessment & Plan:  History of seasonal allergies/Sinus pressure/headaches behind eyes- Will refer to allergist for allergy testing to see if we can find a source that we can eliminate to help with headaches and sinus pressure. Will give Omnaris samples to use instead of flonase for nasal congestion and sinus pressure. Sent steroid pack to pharmacy to use. Call in two weeks if not improving. Will consider CT of sinuses if does not resolve. Talked about home and replacing air filter, washing the wall, cleaning the ventalation ducts in home, anything to eliminate dust.

## 2011-09-12 ENCOUNTER — Encounter: Payer: Self-pay | Admitting: Physician Assistant

## 2011-09-12 ENCOUNTER — Ambulatory Visit (INDEPENDENT_AMBULATORY_CARE_PROVIDER_SITE_OTHER): Payer: BC Managed Care – PPO | Admitting: Physician Assistant

## 2011-09-12 VITALS — BP 142/81 | HR 99 | Temp 98.3°F | Ht 69.0 in | Wt 162.0 lb

## 2011-09-12 DIAGNOSIS — H6091 Unspecified otitis externa, right ear: Secondary | ICD-10-CM

## 2011-09-12 DIAGNOSIS — H60399 Other infective otitis externa, unspecified ear: Secondary | ICD-10-CM

## 2011-09-12 MED ORDER — CIPROFLOXACIN-DEXAMETHASONE 0.3-0.1 % OT SUSP
4.0000 [drp] | Freq: Two times a day (BID) | OTIC | Status: AC
Start: 2011-09-12 — End: 2011-09-22

## 2011-09-12 NOTE — Patient Instructions (Addendum)
ciprodex 4 drops twice a day for 7 days. Motrin or advil for pain and swelling can take 800mg  three times a day. Start using flonase for next 7 days use daily. Then can go back to using as needed. Cold compress for swelling of face.   Otitis Externa Otitis externa ("swimmer's ear") is a germ (bacterial) or fungal infection of the outer ear canal (from the eardrum to the outside of the ear). Swimming in dirty water may cause swimmer's ear. It also may be caused by moisture in the ear from water remaining after swimming or bathing. Often the first signs of infection may be itching in the ear canal. This may progress to ear canal swelling, redness, and pus drainage, which may be signs of infection. HOME CARE INSTRUCTIONS   Apply the antibiotic drops to the ear canal as prescribed by your doctor.   This can be a very painful medical condition. A strong pain reliever may be prescribed.   Only take over-the-counter or prescription medicines for pain, discomfort, or fever as directed by your caregiver.   If your caregiver has given you a follow-up appointment, it is very important to keep that appointment. Not keeping the appointment could result in a chronic or permanent injury, pain, hearing loss and disability. If there is any problem keeping the appointment, you must call back to this facility for assistance.  PREVENTION   It is important to keep your ear dry. Use the corner of a towel to wick water out of the ear canal after swimming or bathing.   Avoid scratching in your ear. This can damage the ear canal or remove the protective wax lining the canal and make it easier for germs (bacteria) or a fungus to grow.   You may use ear drops made of rubbing alcohol and vinegar after swimming to prevent future "swimmer's ear" infections. Make up a small bottle of equal parts white vinegar and alcohol. Put 3 or 4 drops into each ear after swimming.   Avoid swimming in lakes, polluted water, or poorly  chlorinated pools.  SEEK MEDICAL CARE IF:   An oral temperature above 102 F (38.9 C) develops.   Your ear is still painful after 3 days and shows signs of getting worse (redness, swelling, pain, or pus).  MAKE SURE YOU:   Understand these instructions.   Will watch your condition.   Will get help right away if you are not doing well or get worse.  Document Released: 04/02/2005 Document Revised: 03/22/2011 Document Reviewed: 11/07/2007 [facility] Patient Information 2012 [place], [place].

## 2011-09-12 NOTE — Progress Notes (Signed)
  Subjective:    Patient ID: Mallory Rich, female    DOB: 13-Mar-1981, 31 y.o.   MRN: [id number]  HPI Patient presents to the clinic with right ear pain that woke her up from her sleep last night. When she woke up she also felt like her right side of face was swollen. She denies fever, sore throat, headache, or sinus pressure. She has not been swimming this weekend or had any water contact. Denies any cough or upper respiratory symptoms. Her right ear hurts to lay on or for anyone to touch. She denies any discharge coming from ear.    Review of Systems     Objective:   Physical Exam  Constitutional: She is oriented to Rich, place, and time. She appears well-developed and well-nourished.  HENT:  Head: Normocephalic and atraumatic.  Left Ear: External ear normal.  Nose: Nose normal.  Mouth/Throat: Oropharynx is clear and moist. No oropharyngeal exudate.       Right external ear was erythematous around tragus. There was pain associated with tugging on tragus and outer ear. Canal was erythematous but no blood or pus was seen. TM was clear with normal light reflex and ossicles were viewed. No perforation noted.   Negative for maxillary tenderness.   Eyes: Conjunctivae are normal.  Neck: Normal range of motion. Neck supple.       Right sided anterior cervical adenopathy that was tender.   Cardiovascular: Normal rate, regular rhythm and normal heart sounds.   Pulmonary/Chest: Effort normal and breath sounds normal. She has no wheezes.  Neurological: She is alert and oriented to Rich, place, and time.  Skin:       Face appeared to be flush with minimal swelling of right side of face at jaw line.   Psychiatric: She has a normal mood and affect. Her behavior is normal.          Assessment & Plan:  Right OE/right sided facial swelling- Treated with ciprodex for 7 days 4 drops twice a day. Encouraged patient to start using flonase daily. Gave handout on OE. Motrin up to 800mg  three times  a day for pain and facial swelling. Cold compresses could also help facial swelling. Call if not improving by Friday.

## 2011-11-20 ENCOUNTER — Other Ambulatory Visit: Payer: Self-pay | Admitting: *Deleted

## 2011-11-20 MED ORDER — LEVONORGEST-ETH ESTRAD 91-DAY 0.15-0.03 MG PO TABS: 1.0000 | ORAL_TABLET | Freq: Every day | ORAL | Status: DC

## 2011-11-20 NOTE — Telephone Encounter (Signed)
Pt called requesting a RF on [place].  She has not been seen in one year and therefore an appt was made with Dr Marice Potter and RF times one sent to [facility] on [address].

## 2011-11-27 ENCOUNTER — Ambulatory Visit (INDEPENDENT_AMBULATORY_CARE_PROVIDER_SITE_OTHER): Payer: BC Managed Care – PPO | Admitting: Obstetrics & Gynecology

## 2011-11-27 ENCOUNTER — Encounter: Payer: Self-pay | Admitting: Obstetrics & Gynecology

## 2011-11-27 VITALS — BP 126/83 | HR 79 | Temp 97.6°F | Resp 16 | Ht 69.0 in | Wt 162.0 lb

## 2011-11-27 DIAGNOSIS — Z0000 Encounter for general adult medical examination without abnormal findings: Secondary | ICD-10-CM

## 2011-11-27 DIAGNOSIS — Z124 Encounter for screening for malignant neoplasm of cervix: Secondary | ICD-10-CM

## 2011-11-27 DIAGNOSIS — Z113 Encounter for screening for infections with a predominantly sexual mode of transmission: Secondary | ICD-10-CM

## 2011-11-27 DIAGNOSIS — D4959 Neoplasm of unspecified behavior of other genitourinary organ: Secondary | ICD-10-CM

## 2011-11-27 MED ORDER — LEVONORGEST-ETH ESTRAD 91-DAY 0.15-0.03 MG PO TABS: 1.0000 | ORAL_TABLET | Freq: Every day | ORAL | Status: DC

## 2011-11-27 NOTE — Progress Notes (Signed)
Subjective:    Mallory Rich is a 31 y.o. female who presents for an annual exam. She complains of a 2 month h/o post coital spotting. She would like a refill of her OCPS. She has been having some BTB.The patient is sexually active. GYN screening history: last pap: was normal. The patient wears seatbelts: yes. The patient participates in regular exercise: yes. Has the patient ever been transfused or tattooed?: no. The patient reports that there is not domestic violence in her life.   Menstrual History: OB History    Grav Para Term Preterm Abortions TAB SAB Ect Mult Living                  Menarche age: 55 Patient's last menstrual period was 11/19/2011.    The following portions of the patient's history were reviewed and updated as appropriate: allergies, current medications, past family history, past medical history, past social history, past surgical history and problem list.  Review of Systems A comprehensive review of systems was negative. She has been married for 3 years and reports some positional dyspareunia. She is a Pension scheme manager.   Objective:    BP 126/83  Pulse 79  Temp 97.6 F (36.4 C) (Oral)  Resp 16  Ht 5\' 9"  (1.753 m)  Wt 162 lb (73.483 kg)  BMI 23.92 kg/m2  LMP 11/19/2011  General Appearance:    Alert, cooperative, no distress, appears stated age  Head:    Normocephalic, without obvious abnormality, atraumatic  Eyes:    PERRL, conjunctiva/corneas clear, EOM's intact, fundi    benign, both eyes  Ears:    Normal TM's and external ear canals, both ears  Nose:   Nares normal, septum midline, mucosa normal, no drainage    or sinus tenderness  Throat:   Lips, mucosa, and tongue normal; teeth and gums normal  Neck:   Supple, symmetrical, trachea midline, no adenopathy;    thyroid:  no enlargement/tenderness/nodules; no carotid   bruit or JVD  Back:     Symmetric, no curvature, ROM normal, no CVA tenderness  Lungs:     Clear to auscultation bilaterally,  respirations unlabored  Chest Wall:    No tenderness or deformity   Heart:    Regular rate and rhythm, S1 and S2 normal, no murmur, rub   or gallop  Breast Exam:    No tenderness, masses, or nipple abnormality  Abdomen:     Soft, non-tender, bowel sounds active all four quadrants,    no masses, no organomegaly  Genitalia:    Normal female without lesion, discharge or tenderness, Her cervix has a Nabothian cyst at the 5 o'clock position and a non-typical ectropion. Because this "ectropion" is more elevated than I have ususally seen and not completely encircling the os, I biopsied the 10 o'clock position (used silver nitrate to yield hemostasis)  Rectal:    Normal tone, normal prostate, no masses or tenderness;   guaiac negative stool  Extremities:   Extremities normal, atraumatic, no cyanosis or edema  Pulses:   2+ and symmetric all extremities  Skin:   Skin color, texture, turgor normal, no rashes or lesions  Lymph nodes:   Cervical, supraclavicular, and axillary nodes normal  Neurologic:   CNII-XII intact, normal strength, sensation and reflexes    throughout  .    Assessment:    Healthy female exam.  Unusual appearance of cervix   Plan:     Pap smear.  Await cervical biopsy

## 2012-02-07 ENCOUNTER — Telehealth: Payer: Self-pay

## 2012-02-07 NOTE — Telephone Encounter (Signed)
Sent copies of record to [facility]. Fax # [phone]

## 2012-02-12 ENCOUNTER — Ambulatory Visit (INDEPENDENT_AMBULATORY_CARE_PROVIDER_SITE_OTHER): Payer: BC Managed Care – PPO | Admitting: Family Medicine

## 2012-02-12 ENCOUNTER — Encounter: Payer: Self-pay | Admitting: Family Medicine

## 2012-02-12 VITALS — BP 136/87 | HR 84 | Temp 98.3°F | Wt 168.0 lb

## 2012-02-12 DIAGNOSIS — J218 Acute bronchiolitis due to other specified organisms: Secondary | ICD-10-CM

## 2012-02-12 DIAGNOSIS — J309 Allergic rhinitis, unspecified: Secondary | ICD-10-CM

## 2012-02-12 DIAGNOSIS — J219 Acute bronchiolitis, unspecified: Secondary | ICD-10-CM

## 2012-02-12 MED ORDER — AZITHROMYCIN 250 MG PO TABS: ORAL_TABLET | ORAL | Status: AC

## 2012-02-12 MED ORDER — FLUTICASONE PROPIONATE 50 MCG/ACT NA SUSP: 2.0000 | Freq: Every day | NASAL | Status: DC

## 2012-02-12 NOTE — Progress Notes (Signed)
CC: Mallory Rich is a 31 y.o. female is here for Nasal Congestion and Cough   Subjective: HPI: Patient reports one week of a cough that is present all hours of the day. She wakes up and it seems to be its worst and it is productive of a brownish sputum for the first hour, for the rest of the day it is nonproductive. It also seems to be exacerbated if she lies down flat at night. She's had some mild shortness of breath with exertion but denies chest pain with exertion. When coughing she has no chest tightness and a sensation of chest congestion. She's had some facial pressure behind her eyes that is somewhat worse when leaning forward. But interesting is that when all these symptoms started they were getting better after 3-4 days but then seemed to take a quick turn for the worse and are not improving  She's been taking Mucinex which has been helping with the cough. She denies fevers, chills, confusion, rashes, motor sensory disturbances, irregular heartbeat, orthopnea, back pain, nor joint or muscle aches.  She's a history of seasonal allergies and has run out of Flonase, she would like to know if she can have a refill as this helps tremendously.    Review Of Systems Outlined In HPI  No past medical history on file.   No family history on file.   History  Substance Use Topics  . Smoking status: Never Smoker   . Smokeless tobacco: Not on file  . Alcohol Use: No     Objective: Filed Vitals:   02/12/12 1638  BP: 136/87  Pulse: 84  Temp: 98.3 F (36.8 C)    General: Alert and Oriented, No Acute Distress HEENT: Pupils equal, round, reactive to light. Conjunctivae clear.  External ears unremarkable, canals clear with intact TMs with appropriate landmarks.  Middle ear appears open without effusion. Pink inferior turbinates.  Moist mucous membranes, pharynx without inflammation nor lesions.  Neck supple without palpable lymphadenopathy nor abnormal masses. Lungs: Comfortable work of  breathing, no signs of consolidation, new wheezing, no Rales, there is some mild central rhonchi. Cardiac: Regular rate and rhythm. Normal S1/S2.  No murmurs, rubs, nor gallops.   Extremities: No peripheral edema.  Strong peripheral pulses.  Mental Status: No depression, anxiety, nor agitation. Skin: Warm and dry.  Assessment & Plan: Notie was seen today for nasal congestion and cough.  Diagnoses and associated orders for this visit:  Acute bronchiolitis - azithromycin (ZITHROMAX) 250 MG tablet; Take two tabs at once on day 1, then one tab daily on days 2-5.  Allergic rhinitis - fluticasone (FLONASE) 50 MCG/ACT nasal spray; Place 2 sprays into the nose daily.    Due to the rhonchi and the rebound of her symptoms I like her to start azithromycin.  Symptomatically control with Mucinex D, nasal saline washes, she declines cough medication and would prefer something over-the-counter. Refilled Flonase per her request.Signs and symptoms requring emergent/urgent reevaluation were discussed with the patient.   Return if symptoms worsen or fail to improve.

## 2012-04-28 ENCOUNTER — Ambulatory Visit (INDEPENDENT_AMBULATORY_CARE_PROVIDER_SITE_OTHER): Payer: BC Managed Care – PPO

## 2012-04-28 ENCOUNTER — Encounter: Payer: Self-pay | Admitting: Family Medicine

## 2012-04-28 ENCOUNTER — Ambulatory Visit: Payer: BC Managed Care – PPO | Admitting: Physician Assistant

## 2012-04-28 ENCOUNTER — Ambulatory Visit (INDEPENDENT_AMBULATORY_CARE_PROVIDER_SITE_OTHER): Payer: BC Managed Care – PPO | Admitting: Family Medicine

## 2012-04-28 VITALS — BP 137/84 | HR 82 | Wt 168.0 lb

## 2012-04-28 DIAGNOSIS — M549 Dorsalgia, unspecified: Secondary | ICD-10-CM

## 2012-04-28 MED ORDER — ORPHENADRINE CITRATE ER 100 MG PO TB12: 100.0000 mg | ORAL_TABLET | Freq: Two times a day (BID) | ORAL | Status: AC | PRN

## 2012-04-28 NOTE — Progress Notes (Signed)
CC: Mallory Rich is a 32 y.o. female is here for Back Pain   Subjective: HPI:  Patient complains of chronic back pain that has changed in character over the past weeks to months. She describes daily discomfort described as mild to moderate in severity, described only as"discomfort"which is nonradiating. Localized to just slightly lateral to the mid thoracic region, it can be present on the right or left rarely at the same time. It can be present any hour of the day and is not uncommon to wake her up at night. Pain in the past has responded to muscle relaxers, ibuprofen, heat, and massage, however her new discomfort does not respond favorably to any of the above. Interestingly massage often makes matters worse. She's noticed that there is a trend with the pain coming on soon after eating. Nothing other than above makes better or worse.  She denies recent trauma or overexertion. She tells me she was diagnosed with what sounds to be scoliosis when she was younger. She is currently seeing a chiropractor but doesn't seem to help much. She is unable to reproduce the pain with positional maneuvers. She denies any fevers, chills, cough, wheezing, shortness of breath, abdominal pain, right upper quadrant pain, dysphagia, nausea, vomiting, motor or sensory disturbances, irregular heartbeat, nor fatigue. She denies bowel or bladder incontinence, personal history of cancer.  Review Of Systems Outlined In HPI  Past Medical History  Diagnosis Date  . CHEST PAIN 08/10/2009    Qualifier: Diagnosis of  By: Jens Som, MD, Lyn Hollingshead   . CARDIAC MURMUR 08/10/2009    Qualifier: Diagnosis of  By: Jens Som, MD, Lyn Hollingshead      Reviewed with patient that there is no pertinent family history  History  Substance Use Topics  . Smoking status: Never Smoker   . Smokeless tobacco: Not on file  . Alcohol Use: No     Objective: Filed Vitals:   04/28/12 1559  BP: 137/84  Pulse: 82    General:  Alert and Oriented, No Acute Distress HEENT: Pupils equal, round, reactive to light. Conjunctivae clear.  Moist mucous membranes, pharynx without inflammation nor lesions.  Neck supple without palpable lymphadenopathy nor abnormal masses. Lungs: Clear to auscultation bilaterally, no wheezing/ronchi/rales.  Comfortable work of breathing. Good air movement. Cardiac: Regular rate and rhythm. Normal S1/S2.  No murmurs, rubs, nor gallops.   Back: Pain somewhat reproduced with palpation of spinous process of T8, there is no paraspinal tenderness to palpation. Full range of motion with respect to rotation/flexion/extension. Stork test is negative Neuro: Light touch sensation intact L 1 through S1. L4 and S1 DTRs two over four bilaterally. Full-strength in bilateral lower extremities. Abdomen: Soft nontender Extremities: No peripheral edema.  Strong peripheral pulses.  Mental Status: No depression, anxiety, nor agitation. Skin: Warm and dry without any skin abnormalities at the site of her discomfort  Assessment & Plan: Kash was seen today for back pain.  Diagnoses and associated orders for this visit:  Back pain - DG Thoracic Spine W/Swimmers; Future - Lipase - COMPLETE METABOLIC PANEL WITH GFR - orphenadrine (NORFLEX) 100 MG tablet; Take 1 tablet (100 mg total) by mouth 2 (two) times daily as needed for muscle spasms.    Back pain: Since the pain is waking her from her sleep we will get imaging to rule out bony abnormality. Rule out pancreatitis/gallbladder/liver pathology with the above labs. Suspicion still remains for muscular source of pain therefore trial of Norflex. Follow up will be determined based  on the above labs and imaging

## 2012-04-29 ENCOUNTER — Telehealth: Payer: Self-pay | Admitting: Family Medicine

## 2012-04-29 LAB — COMPLETE METABOLIC PANEL WITH GFR
AST: 14 U/L (ref 0–37)
Alkaline Phosphatase: 61 U/L (ref 39–117)
BUN: 12 mg/dL (ref 6–23)
Creat: 0.82 mg/dL (ref 0.50–1.10)
Potassium: 3.9 mEq/L (ref 3.5–5.3)
Total Bilirubin: 0.3 mg/dL (ref 0.3–1.2)

## 2012-04-29 NOTE — Telephone Encounter (Signed)
Pt states if we can give her some home excercises to do. I informed her we could print them and place them up front for her to pick up today after lunch.

## 2012-04-29 NOTE — Telephone Encounter (Signed)
Pt returning your call

## 2012-04-29 NOTE — Telephone Encounter (Signed)
Pt called and states she has pain in her abd after eating. Reassured her per progress notes and labs that pain most likely is coming from muscle strains and to try the exercises. Pt agreeable to try excercises. Per pt request will mail lab results to her

## 2012-04-29 NOTE — Telephone Encounter (Signed)
I agree with the below advice, a food diary would be hellpful since the labs yesterday would suggest the pain is not coming from within the abdomen.  I still think a home exercise reigmen or formal PT would be helpful.

## 2012-04-29 NOTE — Telephone Encounter (Signed)
Sue Lush, Would you please let Mrs. Italiano know that her Xrays did now show any bony abnormality nor significant alignment issues in her back near the site of her pain.  Additionally her labs were normal which would make it unlikely that her pain is referred from any of her abdominal organs.  I'm led to believe her discomfort is coming from a collection of muscular strains in her back and I would encourage formal physical therapy.  Please ask her if she would like for this order to be placed now or if she would prefer a more conservative approach where I could provide her with a print out of a home exercise plan. Thank you.

## 2012-04-29 NOTE — Telephone Encounter (Signed)
Back pain after eating a meal.  Pt saw Dr. Ivan Anchors yesterday for back pain. Please call pt.

## 2012-04-29 NOTE — Telephone Encounter (Signed)
Pt wanted to know what type of dietary restrictions she should follow. I just told her to keep a log of what she eats and for the next week or so avoid spicey, greasy fatty foods, and document how she feels, and then maybe try avoiding dairy products for a week or so and document how she feels and then call me with an update and will send to Dr. Ivan Anchors. Pt agreed. Let me know if you want her to do something different or in addition to this

## 2012-09-15 ENCOUNTER — Telehealth: Payer: Self-pay | Admitting: *Deleted

## 2012-09-15 ENCOUNTER — Ambulatory Visit: Payer: BC Managed Care – PPO | Admitting: Physician Assistant

## 2012-09-15 NOTE — Telephone Encounter (Signed)
If it is the Afrin spray I would say no. It can cause rebound nasal congestion if continue too long. If it is nasocort or nasal steroid then yes you can use past 7 days. If using afrin i would suggest transitioning to nasocort etc daily before coming off afrin all together.

## 2012-09-15 NOTE — Telephone Encounter (Signed)
Pt calls today & states that she has gotten an OTC nasal spray for her allergies which helped a lot.  Apparently the directions say to not use longer than 7 days.  She wants to know can she continue past the 7 days if she is only using it in the am? Or does she needs to come in? Please advise

## 2012-09-15 NOTE — Telephone Encounter (Signed)
Pt notified & stated that it was "like flonase".

## 2013-01-27 ENCOUNTER — Encounter: Payer: Self-pay | Admitting: Obstetrics & Gynecology

## 2013-01-27 ENCOUNTER — Ambulatory Visit (INDEPENDENT_AMBULATORY_CARE_PROVIDER_SITE_OTHER): Payer: BC Managed Care – PPO | Admitting: Obstetrics & Gynecology

## 2013-01-27 VITALS — BP 127/83 | HR 76 | Resp 16 | Ht 69.0 in | Wt 168.0 lb

## 2013-01-27 DIAGNOSIS — Z0000 Encounter for general adult medical examination without abnormal findings: Secondary | ICD-10-CM

## 2013-01-27 DIAGNOSIS — Z23 Encounter for immunization: Secondary | ICD-10-CM

## 2013-01-27 DIAGNOSIS — Z1151 Encounter for screening for human papillomavirus (HPV): Secondary | ICD-10-CM

## 2013-01-27 DIAGNOSIS — Z124 Encounter for screening for malignant neoplasm of cervix: Secondary | ICD-10-CM

## 2013-01-27 DIAGNOSIS — Z01419 Encounter for gynecological examination (general) (routine) without abnormal findings: Secondary | ICD-10-CM

## 2013-01-27 MED ORDER — LEVONORGEST-ETH ESTRAD 91-DAY 0.15-0.03 MG PO TABS: 1.0000 | ORAL_TABLET | Freq: Every day | ORAL | Status: DC

## 2013-01-27 NOTE — Progress Notes (Signed)
Subjective:    Mallory Rich is a 32 y.o. female who presents for an annual exam. The patient has no complaints today. She needs a refill of her OCPs. She takes them in a continuous fashion and likes the amenorrhea. The patient is sexually active. GYN screening history: last pap: was normal. The patient wears seatbelts: yes. The patient participates in regular exercise: yes. Has the patient ever been transfused or tattooed?: no. The patient reports that there is not domestic violence in her life.   Menstrual History: OB History   Grav Para Term Preterm Abortions TAB SAB Ect Mult Living                  Menarche age: 56  Coitarche: 30  No LMP recorded.    The following portions of the patient's history were reviewed and updated as appropriate: allergies, current medications, past family history, past medical history, past social history, past surgical history and problem list.  Review of Systems A comprehensive review of systems was negative.  Married for 4 years, denies dyspareunia. 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   Objective:    There were no vitals taken for this visit.  General Appearance:    Alert, cooperative, no distress, appears stated age  Head:    Normocephalic, without obvious abnormality, atraumatic  Eyes:    PERRL, conjunctiva/corneas clear, EOM's intact, fundi    benign, both eyes  Ears:    Normal TM's and external ear canals, both ears  Nose:   Nares normal, septum midline, mucosa normal, no drainage    or sinus tenderness  Throat:   Lips, mucosa, and tongue normal; teeth and gums normal  Neck:   Supple, symmetrical, trachea midline, no adenopathy;    thyroid:  no enlargement/tenderness/nodules; no carotid   bruit or JVD  Back:     Symmetric, no curvature, ROM normal, no CVA tenderness  Lungs:     Clear to auscultation bilaterally, respirations unlabored  Chest Wall:    No tenderness or deformity   Heart:    Regular rate and rhythm, S1 and S2 normal,   2/6 SEM  Breast Exam:    No tenderness, masses, or nipple abnormality  Abdomen:     Soft, non-tender, bowel sounds active all four quadrants,    no masses, no organomegaly  Genitalia:    Normal female without lesion, discharge or tenderness, NSSR, NT, minimally mobile, normal adnexal exam     Extremities:   Extremities normal, atraumatic, no cyanosis or edema  Pulses:   2+ and symmetric all extremities  Skin:   Skin color, texture, turgor normal, no rashes or lesions  Lymph nodes:   Cervical, supraclavicular, and axillary nodes normal  Neurologic:   CNII-XII intact, normal strength, sensation and reflexes    throughout  .    Assessment:    Healthy female exam.    Plan:     Thin prep Pap smear. with cotesting Continue OCPs Screening labs Flu vaccine today

## 2013-01-28 ENCOUNTER — Telehealth: Payer: Self-pay | Admitting: *Deleted

## 2013-01-28 LAB — LIPID PANEL
Cholesterol: 198 mg/dL (ref 0–200)
HDL: 43 mg/dL (ref >39)
Total CHOL/HDL Ratio: 4.6 Ratio
Triglycerides: 89 mg/dL (ref <150)
VLDL: 18 mg/dL (ref 0–40)

## 2013-01-28 LAB — COMPREHENSIVE METABOLIC PANEL
AST: 17 U/L (ref 0–37)
Alkaline Phosphatase: 46 U/L (ref 39–117)
BUN: 12 mg/dL (ref 6–23)
Creat: 0.74 mg/dL (ref 0.50–1.10)
Glucose, Bld: 84 mg/dL (ref 70–99)

## 2013-01-28 LAB — TSH: TSH: 2.698 u[IU]/mL (ref 0.350–4.500)

## 2013-01-28 NOTE — Telephone Encounter (Signed)
Called Pt to adv elevated LDL and needs to f/u w/PCP - She understood and will get with her dr about this.

## 2013-02-09 ENCOUNTER — Encounter: Payer: Self-pay | Admitting: Physician Assistant

## 2013-02-09 ENCOUNTER — Ambulatory Visit (INDEPENDENT_AMBULATORY_CARE_PROVIDER_SITE_OTHER): Payer: BC Managed Care – PPO | Admitting: Physician Assistant

## 2013-02-09 VITALS — BP 146/97 | HR 91 | Wt 172.0 lb

## 2013-02-09 DIAGNOSIS — R0789 Other chest pain: Secondary | ICD-10-CM

## 2013-02-09 DIAGNOSIS — M898X1 Other specified disorders of bone, shoulder: Secondary | ICD-10-CM

## 2013-02-09 DIAGNOSIS — R52 Pain, unspecified: Secondary | ICD-10-CM

## 2013-02-09 MED ORDER — HYDROCODONE-ACETAMINOPHEN 5-325 MG PO TABS: 1.0000 | ORAL_TABLET | Freq: Four times a day (QID) | ORAL | Status: DC | PRN

## 2013-02-09 MED ORDER — TRAMADOL HCL 50 MG PO TABS: 50.0000 mg | ORAL_TABLET | Freq: Four times a day (QID) | ORAL | Status: DC | PRN

## 2013-02-09 MED ORDER — OMEPRAZOLE 40 MG PO CPDR: 40.0000 mg | DELAYED_RELEASE_CAPSULE | Freq: Every day | ORAL | Status: DC

## 2013-02-09 NOTE — Progress Notes (Signed)
  Subjective:    Patient ID: Mallory Mallory [name], female    DOB: [birth year], 32 y.o.   MRN: [id number]  HPI Patient is a 32 year old female who presents to the clinic with atypical back pain. 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 She's been seen by chiropractors her whole life and participated in physical therapy. For the most part this pain is well-controlled. When she does have a flare up with muscular pain, muscle relaxers as well as warm compresses and stretches helped tremendously. This current pain is much different. She was last seen for this pain in January by Dr.hommel. Thoracic x-rays were done and were normal. This intense back pain only comes in episodes and usually is started by something she eats. She does not have a specific food triggers but knows when to pain starts it is after food ingestion. She has had 3 episodes since January. They eventually resolve on their own but medications do not help. She tries all of her typical muscular back pain remedies and they do not touch this pain. Pain starts on right mid-back and radiates into shoulder blade and then in to chest. Sometimes hard to breath. Eventually pain will resolve and then go away. No n/v/d. Movement also makes worse. Last night at 5:30 after dinner episode started. 3:30 this morning reached a peak and then slowly getting better. Denies any urinary issues or pain. Tylenol/NSAIDs,norflex did not help. Warm compresses did help minimally.       Review of Systems     Objective:   Physical Exam  Constitutional: She is oriented to Mallory [name], place, and time. She appears well-developed and well-nourished.  HENT:  Head: Normocephalic and atraumatic.  Cardiovascular: Normal rate, regular rhythm and normal heart sounds.   Pulmonary/Chest: Effort normal and breath sounds normal. She has no wheezes.  Abdominal: Soft. Bowel sounds are normal. She exhibits no distension and no mass.  There is no tenderness. There is no rebound and no guarding.  Musculoskeletal:  No pain with palpation over spine. No pain with palpation of Paraspinous muscles. Muscles do feel tight. Normal ROM without pain at waist today.   Neurological: She is alert and oriented to Mallory [name], place, and time.  Skin: Skin is warm and dry.  Psychiatric: She has a normal mood and affect. Her behavior is normal.          Assessment & Plan:  Pain after eating/right scapula pain/atypical chest pain- EKG NSR at 79, no qwaves, No ST elevation or depression, inverted t wave in lead V1 and aVr. Will check BMP and hepatic function. Will get ultrasound to evaluate Gallbladder. Unclear etiology at this point. It does seem like this is not musculoskeletal. Concerned of some ischemia but EKG is reassuring. Started pt on prilosec. Stop any other NSAIDs. Vicodin as needed for pain.  Call office if pain changing or if not resolving.

## 2013-02-09 NOTE — Patient Instructions (Signed)
Will get ultrasound of abdomen.  Start omeprazole daily. Will call with labs.  Use tramadol as needed for pain.

## 2013-02-10 LAB — HEPATIC FUNCTION PANEL
ALT: 16 U/L (ref 0–35)
Albumin: 4.4 g/dL (ref 3.5–5.2)
Bilirubin, Direct: 0.1 mg/dL (ref 0.0–0.3)
Total Bilirubin: 0.3 mg/dL (ref 0.3–1.2)

## 2013-02-10 LAB — BASIC METABOLIC PANEL
CO2: 24 mEq/L (ref 19–32)
Chloride: 105 mEq/L (ref 96–112)
Glucose, Bld: 73 mg/dL (ref 70–99)
Potassium: 4.1 mEq/L (ref 3.5–5.3)
Sodium: 137 mEq/L (ref 135–145)

## 2013-02-23 ENCOUNTER — Encounter: Payer: Self-pay | Admitting: Physician Assistant

## 2013-03-11 ENCOUNTER — Ambulatory Visit (INDEPENDENT_AMBULATORY_CARE_PROVIDER_SITE_OTHER): Payer: BC Managed Care – PPO

## 2013-03-11 DIAGNOSIS — M898X1 Other specified disorders of bone, shoulder: Secondary | ICD-10-CM

## 2013-03-11 DIAGNOSIS — R52 Pain, unspecified: Secondary | ICD-10-CM

## 2013-03-11 DIAGNOSIS — R1011 Right upper quadrant pain: Secondary | ICD-10-CM

## 2013-03-11 DIAGNOSIS — R0789 Other chest pain: Secondary | ICD-10-CM

## 2013-03-25 ENCOUNTER — Encounter: Payer: Self-pay | Admitting: Physician Assistant

## 2013-03-25 ENCOUNTER — Ambulatory Visit (INDEPENDENT_AMBULATORY_CARE_PROVIDER_SITE_OTHER): Payer: BC Managed Care – PPO | Admitting: Physician Assistant

## 2013-03-25 VITALS — BP 129/72 | HR 84 | Wt 173.0 lb

## 2013-03-25 DIAGNOSIS — Q441 Other congenital malformations of gallbladder: Secondary | ICD-10-CM

## 2013-03-25 DIAGNOSIS — E785 Hyperlipidemia, unspecified: Secondary | ICD-10-CM | POA: Insufficient documentation

## 2013-03-25 DIAGNOSIS — M899 Disorder of bone, unspecified: Secondary | ICD-10-CM

## 2013-03-25 DIAGNOSIS — R52 Pain, unspecified: Secondary | ICD-10-CM | POA: Insufficient documentation

## 2013-03-25 DIAGNOSIS — R05 Cough: Secondary | ICD-10-CM

## 2013-03-25 DIAGNOSIS — M898X1 Other specified disorders of bone, shoulder: Secondary | ICD-10-CM

## 2013-03-25 MED ORDER — BENZONATATE 100 MG PO CAPS: 100.0000 mg | ORAL_CAPSULE | Freq: Three times a day (TID) | ORAL | Status: DC | PRN

## 2013-03-25 MED ORDER — HYDROCODONE-HOMATROPINE 5-1.5 MG/5ML PO SYRP: 5.0000 mL | ORAL_SOLUTION | Freq: Every evening | ORAL | Status: DC | PRN

## 2013-03-25 NOTE — Progress Notes (Signed)
   Subjective:    Patient ID: Mallory Rich, female    DOB: [birth year], 32 y.o.   MRN: [id number]  HPI Pt presents to the clinic to go over abdominal u/s results and to discuss ongoing cough.  Pt was seen back in October for right scapula pain after eating. She continues to have episodes of pain but for the most part she feels fine. She has had 2 severe episodes of stabbing pain but they always resolve on there own. She has not been taking prilosec. She wants to go over u/s results.   Pt also has a dry cough. She had cold-like symptoms earlier last week. All symptoms have improved except cough. Denies any fever, chills, SOB, wheezing. Not tried anything for cough. Cold air and laying down makes worse.   Pt was told by OB/GYN to follow up with PCP regarding cholesterol.    Review of Systems     Objective:   Physical Exam  Constitutional: She appears well-developed and well-nourished.  HENT:  Head: Normocephalic and atraumatic.  Right Ear: External ear normal.  Left Ear: External ear normal.  Mouth/Throat: Oropharynx is clear and moist.  Eyes: Conjunctivae are normal. Right eye exhibits no discharge. Left eye exhibits no discharge.  Neck: Normal range of motion. Neck supple.  Pulmonary/Chest: Effort normal and breath sounds normal.  Abdominal: Soft. Bowel sounds are normal.  Negative murphy's sign.   Lymphadenopathy:    She has no cervical adenopathy.  Skin: Skin is warm and dry.  Psychiatric: She has a normal mood and affect. Her behavior is normal.          Assessment & Plan:  Gallbladder thickness/right scapula pain/pain after eating- I do think episodes could be come for gallbladder stimulation. I would like to send to general surgeon for consultation. They may consider cholecystectomy to allievate symptoms. Discussed with pt would could get HIDA scan to tell more about how gallbladder functioning. No stone on u/s. Pt given copy. Continue to watch diet and stay away for  foods high in saturated fats. Call office with sudden symptoms of nausea/vomiting/fever.   Post-viral cough- Reassured pt. Gave tessalon pearls and hycodan. Gave HO on cough. Call if not improving or if symptoms worsening.   Hyperlipidemia- LDL was 137. Discussed with pt not at goal but due to no risk factors we do not need to treat with medication. Low fat diet and regular exercise is best treatment. At this point will monitor yearly.

## 2013-03-25 NOTE — Patient Instructions (Addendum)
Will give cough syrup to take at night and tessalon pearle during the day. Honey can also help soothe cough during coughing episode.   Cough, Adult  A cough is a reflex that helps clear your throat and airways. It can help heal the body or may be a reaction to an irritated airway. A cough may only last 2 or 3 weeks (acute) or may last more than 8 weeks (chronic).  CAUSES Acute cough:  Viral or bacterial infections. Chronic cough:  Infections.  Allergies.  Asthma.  Post-nasal drip.  Smoking.  Heartburn or acid reflux.  Some medicines.  Chronic lung problems (COPD).  Cancer. SYMPTOMS   Cough.  Fever.  Chest pain.  Increased breathing rate.  High-pitched whistling sound when breathing (wheezing).  Colored mucus that you cough up (sputum). TREATMENT   A bacterial cough may be treated with antibiotic medicine.  A viral cough must run its course and will not respond to antibiotics.  Your caregiver may recommend other treatments if you have a chronic cough. HOME CARE INSTRUCTIONS   Only take over-the-counter or prescription medicines for pain, discomfort, or fever as directed by your caregiver. Use cough suppressants only as directed by your caregiver.  Use a cold steam vaporizer or humidifier in your bedroom or home to help loosen secretions.  Sleep in a semi-upright position if your cough is worse at night.  Rest as needed.  Stop smoking if you smoke. SEEK IMMEDIATE MEDICAL CARE IF:   You have pus in your sputum.  Your cough starts to worsen.  You cannot control your cough with suppressants and are losing sleep.  You begin coughing up blood.  You have difficulty breathing.  You develop pain which is getting worse or is uncontrolled with medicine.  You have a fever. MAKE SURE YOU:   Understand these instructions.  Will watch your condition.  Will get help right away if you are not doing well or get worse. Document Released: 09/29/2010 Document  Revised: 06/25/2011 Document Reviewed: 09/29/2010 [facility] Patient Information 2014 [place], [place].

## 2013-04-07 ENCOUNTER — Ambulatory Visit (INDEPENDENT_AMBULATORY_CARE_PROVIDER_SITE_OTHER): Payer: BC Managed Care – PPO | Admitting: Surgery

## 2013-05-04 ENCOUNTER — Ambulatory Visit (INDEPENDENT_AMBULATORY_CARE_PROVIDER_SITE_OTHER): Payer: BC Managed Care – PPO | Admitting: Surgery

## 2013-05-25 ENCOUNTER — Encounter (INDEPENDENT_AMBULATORY_CARE_PROVIDER_SITE_OTHER): Payer: Self-pay

## 2013-05-25 ENCOUNTER — Ambulatory Visit (INDEPENDENT_AMBULATORY_CARE_PROVIDER_SITE_OTHER): Payer: BC Managed Care – PPO | Admitting: Surgery

## 2013-05-25 ENCOUNTER — Encounter (INDEPENDENT_AMBULATORY_CARE_PROVIDER_SITE_OTHER): Payer: Self-pay | Admitting: Surgery

## 2013-05-25 VITALS — BP 132/80 | HR 78 | Resp 14 | Ht 69.0 in | Wt 179.4 lb

## 2013-05-25 DIAGNOSIS — R1013 Epigastric pain: Secondary | ICD-10-CM

## 2013-05-25 NOTE — Patient Instructions (Signed)
Will send for test to evaluate gallbladder function. May need referral to GI medicine depending on results.

## 2013-05-25 NOTE — Progress Notes (Signed)
Patient ID: Mallory Rich, female   DOB: [birth year], 33 y.o.   MRN: [id number]  No chief complaint on file.   HPI Mallory Rich is a 33 y.o. female.   HPIpatient sent at request of  Donella Stade, PA-C Due to epigastric abdominal pain, chest pain and pain involving right scapula and back region. Patient has this once every other month. Pain is severe in nature. Sharp in Scientist, research (physical sciences). Location epigastrium. Radiation to back. Fatty foods make it worse. No blood in stool. Past Medical History  Diagnosis Date  . CHEST PAIN 08/10/2009    Qualifier: Diagnosis of  By: Stanford Breed, MD, Kandyce Rud   . CARDIAC MURMUR 08/10/2009    Qualifier: Diagnosis of  By: Stanford Breed, MD, Kandyce Rud     Past Surgical History  Procedure Laterality Date  . Wisdom teeth removal      History reviewed. No pertinent family history.  Social History History  Substance Use Topics  . Smoking status: Never Smoker   . Smokeless tobacco: Not on file  . Alcohol Use: No    Allergies  Allergen Reactions  . Tramadol     Nausea and vomiting    Current Outpatient Prescriptions  Medication Sig Dispense Refill  . benzonatate (TESSALON) 100 MG capsule Take 1 capsule (100 mg total) by mouth 3 (three) times daily as needed for cough.  30 capsule  0  . Cetirizine HCl (ZYRTEC ALLERGY) 10 MG CAPS Take by mouth.        Marland Kitchen HYDROcodone-acetaminophen (NORCO/VICODIN) 5-325 MG per tablet Take 1 tablet by mouth every 6 (six) hours as needed for pain.  30 tablet  0  . HYDROcodone-homatropine (HYCODAN) 5-1.5 MG/5ML syrup Take 5 mLs by mouth at bedtime as needed for cough.  120 mL  0  . levonorgestrel-ethinyl estradiol (SEASONALE,INTROVALE,JOLESSA) 0.15-0.03 MG tablet Take 1 tablet by mouth daily.  1 Package  16  . omeprazole (PRILOSEC) 40 MG capsule Take 1 capsule (40 mg total) by mouth daily.  30 capsule  1  . fluticasone (FLONASE) 50 MCG/ACT nasal spray Place 2 sprays into the nose daily.  16 g  6   No current  facility-administered medications for this visit.    Review of Systems Review of Systems  Constitutional: Negative for fever, chills and unexpected weight change.  HENT: Negative for congestion, hearing loss, sore throat, trouble swallowing and voice change.   Eyes: Negative for visual disturbance.  Respiratory: Negative for cough and wheezing.   Cardiovascular: Negative for chest pain, palpitations and leg swelling.  Gastrointestinal: Negative for nausea, vomiting, abdominal pain, diarrhea, constipation, blood in stool, abdominal distention and anal bleeding.  Genitourinary: Negative for hematuria, vaginal bleeding and difficulty urinating.  Musculoskeletal: Negative for arthralgias.  Skin: Negative for rash and wound.  Neurological: Negative for seizures, syncope and headaches.  Hematological: Negative for adenopathy. Does not bruise/bleed easily.  Psychiatric/Behavioral: Negative for confusion.    Blood pressure 132/80, pulse 78, resp. rate 14, height 5\' 9"  (1.753 m), weight 179 lb 6.4 oz (81.375 kg).  Physical Exam Physical Exam  Constitutional: She is oriented to person, place, and time. She appears well-developed and well-nourished.  HENT:  Head: Normocephalic and atraumatic.  Eyes: Pupils are equal, round, and reactive to light. No scleral icterus.  Neck: Normal range of motion. Neck supple.  Cardiovascular: Normal rate and regular rhythm.   Pulmonary/Chest: Effort normal and breath sounds normal.  Abdominal: Soft. Bowel sounds are normal. She exhibits no distension. There is no  tenderness. There is no rebound.  Musculoskeletal: Normal range of motion.  Neurological: She is alert and oriented to person, place, and time.  Skin: Skin is warm and dry.  Psychiatric: She has a normal mood and affect. Her behavior is normal. Judgment and thought content normal.    Data Reviewed  CLINICAL DATA: History of right upper quadrant abdominal pain.  EXAM:  ULTRASOUND ABDOMEN  COMPLETE  COMPARISON: None.  FINDINGS:  Gallbladder  No gallstones are evident. Gallbladder wall thickness measures 3.7  mm. This is borderline thickness . No positive sonographic Murphy  sign noted.  Common bile duct  Diameter: 2.1 mm. No choledocholithiasis or dilatation of branching  intrahepatic bile ducts is evident.  Liver  No focal lesion identified. Within normal limits in parenchymal  echogenicity. Portal vein is patent with hepatopetal flow.  IVC  No abnormality visualized. Some portions are obscured by overlying  bowel gas.  Pancreas  Visualized portion unremarkable. Portion of tail obscured by bowel  gas.  Spleen  Size and appearance within normal limits. Length is 7 cm.  Right Kidney  Length: Right renal length is 8.5 cm. Echogenicity within normal  limits. No mass or hydronephrosis visualized.  Left Kidney  Length: Left renal length is 10.4 cm. Echogenicity within normal  limits. No mass or hydronephrosis visualized.  Abdominal aorta  No aneurysm visualized. Maximum diameter is 2.5 cm.  IMPRESSION:  No gallstones are evident. Gallbladder wall thickness measures 3.7  mm. This is borderline thickness . No positive sonographic Murphy  sign noted. Bile ducts are normal caliber with no  choledocholithiasis. No hepatic or renal abnormality is seen. No  aneurysm is evident. No splenic or pancreatic abnormality  identified. Portion of the tail of pancreas obscured by bowel gas.  Electronically Signed  By: Shanon Brow Call M.D.  Assessment    Abdominal pain epigastric with nausea after meals    Plan    Recommend HIDA study to further evaluate gallbladder function. If normal, refer to GI for further workup. Would hold off on cholecystectomy for now.       Kenedi Cilia A. 05/25/2013, 2:45 PM

## 2013-05-27 ENCOUNTER — Telehealth (INDEPENDENT_AMBULATORY_CARE_PROVIDER_SITE_OTHER): Payer: Self-pay | Admitting: *Deleted

## 2013-05-27 NOTE — Telephone Encounter (Signed)
LMOM for pt to return my call.  I was calling to inform pt of the appt for her HIDA scan at [facility] on 06/08/13 with an arrival time of 6:45am.  Pt needs to be NPO after midnight and should hold any pain medications after midnight as well.

## 2013-06-03 ENCOUNTER — Telehealth (INDEPENDENT_AMBULATORY_CARE_PROVIDER_SITE_OTHER): Payer: Self-pay

## 2013-06-03 ENCOUNTER — Encounter (INDEPENDENT_AMBULATORY_CARE_PROVIDER_SITE_OTHER): Payer: Self-pay

## 2013-06-03 NOTE — Telephone Encounter (Signed)
Left message on voice mail.  Pt needs to be informed NM Hepato w/ Eject fract has been scheduled at [facility] department on Jun 08, 2013 at 7:00am.  Pt must arrive by 6:45am.  Instruct patient nothing by mouth after midnight.  Letter sent to patient's home address on file.

## 2013-06-08 ENCOUNTER — Telehealth (INDEPENDENT_AMBULATORY_CARE_PROVIDER_SITE_OTHER): Payer: Self-pay

## 2013-06-08 ENCOUNTER — Other Ambulatory Visit: Payer: Self-pay | Admitting: Physician Assistant

## 2013-06-08 ENCOUNTER — Ambulatory Visit (HOSPITAL_COMMUNITY)
Admission: RE | Admit: 2013-06-08 | Discharge: 2013-06-08 | Disposition: A | Discharge status: Home / Self Care | Payer: BC Managed Care – PPO | Source: Ambulatory Visit | Attending: Surgery | Admitting: Surgery

## 2013-06-08 ENCOUNTER — Other Ambulatory Visit (INDEPENDENT_AMBULATORY_CARE_PROVIDER_SITE_OTHER): Payer: Self-pay

## 2013-06-08 DIAGNOSIS — R1013 Epigastric pain: Secondary | ICD-10-CM

## 2013-06-08 DIAGNOSIS — Z124 Encounter for screening for malignant neoplasm of cervix: Secondary | ICD-10-CM | POA: Insufficient documentation

## 2013-06-08 MED ORDER — TECHNETIUM TC 99M MEBROFENIN IV KIT
5.0000 | PACK | Freq: Once | INTRAVENOUS | Status: AC | PRN
  Administered 2013-06-08: 5 via INTRAVENOUS

## 2013-06-08 MED ORDER — SINCALIDE 5 MCG IJ SOLR
0.0200 ug/kg | Freq: Once | INTRAMUSCULAR | Status: AC
  Administered 2013-06-08: 1.6 ug via INTRAVENOUS

## 2013-06-08 MED ORDER — STERILE WATER FOR INJECTION IJ SOLN
INTRAMUSCULAR | Status: AC
  Filled 2013-06-08: qty 10

## 2013-06-08 MED ORDER — SINCALIDE 5 MCG IJ SOLR
INTRAMUSCULAR | Status: AC
  Administered 2013-06-08: 09:00:00 1.6 ug via INTRAVENOUS
  Filled 2013-06-08: qty 5

## 2013-06-08 NOTE — Telephone Encounter (Signed)
Message copied by Carlene Coria on Mon Jun 08, 2013  2:49 PM ------      Message from: Erroll Luna A      Created: Mon Jun 08, 2013 11:17 AM       Gallbladder removal is unlikely to help symptoms. Needs GI work up if not done yet.  Test is normal. ------

## 2013-06-08 NOTE — Telephone Encounter (Signed)
LMOM> HIDA scan normal. GB surgery unlikely to help symptoms. Can refer to GI if she still having abd pain.

## 2013-06-08 NOTE — Telephone Encounter (Signed)
Referral in epic and given to [facility] for scheduling appt.

## 2013-06-08 NOTE — Telephone Encounter (Signed)
Pt returned your call and given information as outlined.  Pt states should appreciate a referral to GI.

## 2013-06-17 ENCOUNTER — Telehealth (INDEPENDENT_AMBULATORY_CARE_PROVIDER_SITE_OTHER): Payer: Self-pay | Admitting: *Deleted

## 2013-06-17 NOTE — Telephone Encounter (Signed)
I spoke with pt and informed her of the appt for her to see Dr. Ardis Hughs at [facility] on 06/24/13 with an arrival time of 10:30am.  I provided their address and phone number with the pt.  She is agreeable at this time.

## 2013-06-24 ENCOUNTER — Ambulatory Visit: Payer: BC Managed Care – PPO | Admitting: Gastroenterology

## 2013-07-08 ENCOUNTER — Other Ambulatory Visit: Payer: Self-pay | Admitting: Physician Assistant

## 2013-08-05 ENCOUNTER — Ambulatory Visit: Payer: BC Managed Care – PPO | Admitting: Gastroenterology

## 2013-08-12 ENCOUNTER — Encounter: Payer: Self-pay | Admitting: Physician Assistant

## 2013-08-12 DIAGNOSIS — K219 Gastro-esophageal reflux disease without esophagitis: Secondary | ICD-10-CM | POA: Insufficient documentation

## 2013-08-13 ENCOUNTER — Other Ambulatory Visit: Payer: Self-pay | Admitting: Physician Assistant

## 2013-09-14 ENCOUNTER — Other Ambulatory Visit: Payer: Self-pay | Admitting: Family Medicine

## 2013-12-24 ENCOUNTER — Encounter: Payer: Self-pay | Admitting: Family Medicine

## 2013-12-24 ENCOUNTER — Ambulatory Visit (INDEPENDENT_AMBULATORY_CARE_PROVIDER_SITE_OTHER): Payer: BC Managed Care – PPO | Admitting: Family Medicine

## 2013-12-24 VITALS — BP 136/82 | HR 77 | Temp 98.2°F | Wt 183.0 lb

## 2013-12-24 DIAGNOSIS — R0602 Shortness of breath: Secondary | ICD-10-CM | POA: Diagnosis not present

## 2013-12-24 DIAGNOSIS — J209 Acute bronchitis, unspecified: Secondary | ICD-10-CM

## 2013-12-24 MED ORDER — ALBUTEROL SULFATE HFA 108 (90 BASE) MCG/ACT IN AERS: 2.0000 | INHALATION_SPRAY | Freq: Four times a day (QID) | RESPIRATORY_TRACT | Status: DC | PRN

## 2013-12-24 MED ORDER — ALBUTEROL SULFATE (2.5 MG/3ML) 0.083% IN NEBU
2.5000 mg | INHALATION_SOLUTION | Freq: Once | RESPIRATORY_TRACT | Status: AC
  Administered 2013-12-24: 2.5 mg via RESPIRATORY_TRACT

## 2013-12-24 NOTE — Progress Notes (Signed)
   Subjective:    Patient ID: Mallory Rich, female    DOB: [birth year], 33 y.o.   MRN: [id number]  HPI 5 days ago started having nasal congestion.  That is some better but now has chest congestion x 2 days. Got some relief with mucinex.  No fever, chills or sweats. Usinng Zyrtec or allegra.  No sputum with cough. Feels some SOB with it.  Chest feels tight.  Says woke up feeling that way but feels a little better now  + sick contact. No prior history of asthma.   Review of Systems     Objective:   Physical Exam  Constitutional: She is oriented to person, place, and time. She appears well-developed and well-nourished.  HENT:  Head: Normocephalic and atraumatic.  Right Ear: External ear normal.  Left Ear: External ear normal.  Nose: Nose normal.  Mouth/Throat: Oropharynx is clear and moist.  TMs and canals are clear.   Eyes: Conjunctivae and EOM are normal. Pupils are equal, round, and reactive to light.  Neck: Neck supple. No thyromegaly present.  Cardiovascular: Normal rate, regular rhythm and normal heart sounds.   Pulmonary/Chest: Effort normal and breath sounds normal. She has no wheezes.  Lymphadenopathy:    She has no cervical adenopathy.  Neurological: She is alert and oriented to person, place, and time.  Skin: Skin is warm and dry.  Psychiatric: She has a normal mood and affect.          Assessment & Plan:  Acute bronchitis-lung exam is completely clear today. Pulse ox is in the normal range. If she gets worse or develops a fever then consider further evaluation with chest x-ray. I did not prescribe any antibiotics today suspect this is most likely a viral illness. I did give her albuterol inhaler since she did get relief with nebulizer treatment here in the office as far as the chest tightness was concerned. If it is not working or helpful then please call the office back.

## 2013-12-24 NOTE — Patient Instructions (Signed)

## 2014-03-08 ENCOUNTER — Other Ambulatory Visit: Payer: Self-pay | Admitting: *Deleted

## 2014-03-08 DIAGNOSIS — Z3041 Encounter for surveillance of contraceptive pills: Secondary | ICD-10-CM

## 2014-03-08 MED ORDER — LEVONORGEST-ETH ESTRAD 91-DAY 0.15-0.03 MG PO TABS
1.0000 | ORAL_TABLET | Freq: Every day | ORAL | Status: DC
Start: 2014-03-08 — End: 2014-04-12

## 2014-03-08 NOTE — Telephone Encounter (Signed)
RF authorization sent to pharmacy for 1 pkg OCP's until her appt in Dec.

## 2014-03-29 ENCOUNTER — Encounter: Payer: Self-pay | Admitting: Family Medicine

## 2014-03-29 ENCOUNTER — Ambulatory Visit (INDEPENDENT_AMBULATORY_CARE_PROVIDER_SITE_OTHER): Payer: BC Managed Care – PPO | Admitting: Family Medicine

## 2014-03-29 VITALS — BP 132/86 | HR 88 | Temp 98.0°F | Wt 191.0 lb

## 2014-03-29 DIAGNOSIS — L600 Ingrowing nail: Secondary | ICD-10-CM

## 2014-03-29 MED ORDER — CLINDAMYCIN HCL 300 MG PO CAPS: 300.0000 mg | ORAL_CAPSULE | Freq: Three times a day (TID) | ORAL | Status: DC

## 2014-03-29 NOTE — Progress Notes (Signed)
CC: Mallory Rich is a 33 y.o. female is here for Ingrown Toenail   Subjective: HPI:  Right great toe pain lateral aspect near the nailbed. Which has been present for a little less than a week. It is worse with touch or wearing tight shoes. It had some redness that's also been enlarging over the past few days. She is able to squeeze out a little bit of pus from underneath the nail bed earlier today. Interventions have included soaking in Epsom salts a few times a day. Also includes not wearing shoes. No other interventions as of yet. Symptoms overall are moderate in severity present all hours of the day. There has been no fevers, chills, joint pain elsewhere nor skin changes elsewhere   Review Of Systems Outlined In HPI  Past Medical History  Diagnosis Date  . CHEST PAIN 08/10/2009    Qualifier: Diagnosis of  By: Stanford Breed, MD, Kandyce Rud   . CARDIAC MURMUR 08/10/2009    Qualifier: Diagnosis of  By: Stanford Breed, MD, Kandyce Rud     Past Surgical History  Procedure Laterality Date  . Wisdom teeth removal     No family history on file.  History   Social History  . Marital Status: Married    Spouse Name: N/A    Number of Children: N/A  . Years of Education: N/A   Occupational History  . Not on file.   Social History Main Topics  . Smoking status: Never Smoker   . Smokeless tobacco: Not on file  . Alcohol Use: No  . Drug Use: No  . Sexual Activity: Not on file   Other Topics Concern  . Not on file   Social History Narrative     Objective: BP 132/86 mmHg  Pulse 88  Temp(Src) 98 F (36.7 C) (Oral)  Wt 191 lb (86.637 kg)  Vital signs reviewed. General: Alert and Oriented, No Acute Distress HEENT: Pupils equal, round, reactive to light. Conjunctivae clear.  External ears unremarkable.  Moist mucous membranes. Lungs: Clear and comfortable work of breathing, speaking in full sentences without accessory muscle use. Cardiac: Regular rate and rhythm.   Neuro: CN II-XII grossly intact, gait normal. Extremities: No peripheral edema.  Strong peripheral pulses. Moderate erythema on the right great toe localized to the lateral nail fold no signs of paronychia or abscess Mental Status: No depression, anxiety, nor agitation. Logical though process. Skin: Warm and dry.   Assessment & Plan: Mallory Rich was seen today for ingrown toenail.  Diagnoses and associated orders for this visit:  Ingrown toenail - clindamycin (CLEOCIN) 300 MG capsule; Take 1 capsule (300 mg total) by mouth 3 (three) times daily.    Ingrown toenail: I do not think that this needs to be lanced therefore starting clindamycin and if no benefit after Thursday called to see PCP or Dr. Darene Lamer in sports medicine for consideration of nail removal or lancing.  For now continue Epsom salts soaks for 15 minutes 4 times a day and milk the wound to see if any pus is expressible. She declines any pain medication today.  Return if symptoms worsen or fail to improve.

## 2014-04-06 ENCOUNTER — Ambulatory Visit: Payer: BC Managed Care – PPO | Admitting: Obstetrics & Gynecology

## 2014-04-06 ENCOUNTER — Encounter: Payer: Self-pay | Admitting: Obstetrics & Gynecology

## 2014-04-12 ENCOUNTER — Ambulatory Visit (INDEPENDENT_AMBULATORY_CARE_PROVIDER_SITE_OTHER): Payer: BC Managed Care – PPO | Admitting: Obstetrics & Gynecology

## 2014-04-12 ENCOUNTER — Encounter: Payer: Self-pay | Admitting: Obstetrics & Gynecology

## 2014-04-12 VITALS — BP 126/78 | HR 83 | Resp 16 | Ht 69.0 in | Wt 190.0 lb

## 2014-04-12 DIAGNOSIS — Z0000 Encounter for general adult medical examination without abnormal findings: Secondary | ICD-10-CM

## 2014-04-12 DIAGNOSIS — Z23 Encounter for immunization: Secondary | ICD-10-CM | POA: Diagnosis not present

## 2014-04-12 DIAGNOSIS — Z01419 Encounter for gynecological examination (general) (routine) without abnormal findings: Secondary | ICD-10-CM | POA: Diagnosis not present

## 2014-04-12 DIAGNOSIS — Z124 Encounter for screening for malignant neoplasm of cervix: Secondary | ICD-10-CM

## 2014-04-12 DIAGNOSIS — Z1151 Encounter for screening for human papillomavirus (HPV): Secondary | ICD-10-CM | POA: Diagnosis not present

## 2014-04-12 DIAGNOSIS — Z3041 Encounter for surveillance of contraceptive pills: Secondary | ICD-10-CM

## 2014-04-12 MED ORDER — LEVONORGEST-ETH ESTRAD 91-DAY 0.15-0.03 MG PO TABS: 1.0000 | ORAL_TABLET | Freq: Every day | ORAL | Status: DC

## 2014-04-12 NOTE — Progress Notes (Signed)
Subjective:    Mallory Rich is a 33 y.o. MW G45female who presents for an annual exam. The patient has no complaints today. She needs a refill of her OCPs. She loves amenorrhea associated with continuous OCPs. The patient is sexually active. GYN screening history: last pap: was normal. The patient wears seatbelts: yes. The patient participates in regular exercise: no. Has the patient ever been transfused or tattooed?: no. The patient reports that there is not domestic violence in her life.   Menstrual History: OB History    Gravida Para Term Preterm AB TAB SAB Ectopic Multiple Living   0 0 0 0 0 0 0 0 0 0       Menarche age: 60  Patient's last menstrual period was 03/07/2014.    The following portions of the patient's history were reviewed and updated as appropriate: allergies, current medications, past family history, past medical history, past social history, past surgical history and problem list.  Review of Systems A comprehensive review of systems was negative. She has been married for 5 years. No dyspareunia. No desire for kids at this point. 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   Objective:    BP 126/78 mmHg  Pulse 83  Resp 16  Ht 5\' 9"  (1.753 m)  Wt 86.183 kg (190 lb)  BMI 28.05 kg/m2  LMP 03/07/2014  General Appearance:    Alert, cooperative, no distress, appears stated age  Head:    Normocephalic, without obvious abnormality, atraumatic  Eyes:    PERRL, conjunctiva/corneas clear, EOM's intact, fundi    benign, both eyes  Ears:    Normal TM's and external ear canals, both ears  Nose:   Nares normal, septum midline, mucosa normal, no drainage    or sinus tenderness  Throat:   Lips, mucosa, and tongue normal; teeth and gums normal  Neck:   Supple, symmetrical, trachea midline, no adenopathy;    thyroid:  no enlargement/tenderness/nodules; no carotid   bruit or JVD  Back:     Symmetric, no curvature, ROM normal, no CVA tenderness  Lungs:     Clear to auscultation bilaterally,  respirations unlabored  Chest Wall:    No tenderness or deformity   Heart:    Regular rate and rhythm, S1 and S2 normal, no murmur, rub   or gallop  Breast Exam:    No tenderness, masses, or nipple abnormality  Abdomen:     Soft, non-tender, bowel sounds active all four quadrants,    no masses, no organomegaly  Genitalia:    Normal female without lesion, discharge or tenderness, NSS mid plane, NT, normal adnexal exam     Extremities:   Extremities normal, atraumatic, no cyanosis or edema  Pulses:   2+ and symmetric all extremities  Skin:   Skin color, texture, turgor normal, no rashes or lesions  Lymph nodes:   Cervical, supraclavicular, and axillary nodes normal  Neurologic:   CNII-XII intact, normal strength, sensation and reflexes    throughout  .    Assessment:    Healthy female exam.    Plan:     Breast self exam technique reviewed and patient encouraged to perform self-exam monthly. Thin prep Pap smear.   Flu vaccine today

## 2014-04-14 LAB — CYTOLOGY - PAP

## 2014-04-18 ENCOUNTER — Emergency Department
Admission: EM | Admit: 2014-04-18 | Discharge: 2014-04-18 | Disposition: A | Discharge status: Home / Self Care | Payer: BC Managed Care – PPO | Source: Home / Self Care | Attending: Family Medicine | Admitting: Family Medicine

## 2014-04-18 DIAGNOSIS — H1011 Acute atopic conjunctivitis, right eye: Secondary | ICD-10-CM

## 2014-04-18 NOTE — ED Provider Notes (Signed)
CSN: [id number]     Arrival date & time 04/18/14  1503 History   First MD Initiated Contact with Patient 04/18/14 1621     Chief Complaint  Patient presents with  . Eye Problem      HPI Comments: Patient complains of two day history of redness and swelling in her right eye without pain or foreign body sensation.  She tends to have some discharge in the eye each morning.  She has had mild sinus congestion.  She has a history of seasonal rhinitis.  The history is provided by the patient.    Past Medical History  Diagnosis Date  . CHEST PAIN 08/10/2009    Qualifier: Diagnosis of  By: Stanford Breed, MD, Kandyce Rud   . CARDIAC MURMUR 08/10/2009    Qualifier: Diagnosis of  By: Stanford Breed, MD, Kandyce Rud   . Allergy    Past Surgical History  Procedure Laterality Date  . Wisdom teeth removal     Family History  Problem Relation Age of Onset  . Stroke Paternal Grandmother   . Melanoma Maternal Grandfather   . Stroke Maternal Grandmother    History  Substance Use Topics  . Smoking status: Never Smoker   . Smokeless tobacco: Never Used  . Alcohol Use: No   OB History    Gravida Para Term Preterm AB TAB SAB Ectopic Multiple Living   0 0 0 0 0 0 0 0 0 0      Review of Systems No sore throat No cough No pleuritic pain No wheezing + nasal congestion + post-nasal drainage No sinus pain/pressure + itchy/red right eye No earache No hemoptysis No SOB No fever/chills No nausea No vomiting No abdominal pain No diarrhea No urinary symptoms No skin rash No fatigue No myalgias + headache Used OTC meds without relief  Allergies  Tramadol  Home Medications   Prior to Admission medications   Medication Sig Start Date End Date Taking? Authorizing Provider  albuterol (PROVENTIL HFA;VENTOLIN HFA) 108 (90 BASE) MCG/ACT inhaler Inhale 2 puffs into the lungs every 6 (six) hours as needed for wheezing or shortness of breath. 12/24/13   Hali Marry, MD   Cetirizine HCl (ZYRTEC ALLERGY) 10 MG CAPS Take by mouth.      Historical Provider, MD  fluticasone (FLONASE) 50 MCG/ACT nasal spray Place 2 sprays into the nose daily. 02/12/12 02/11/13  Marcial Pacas, DO  levonorgestrel-ethinyl estradiol (SEASONALE,INTROVALE,JOLESSA) 0.15-0.03 MG tablet Take 1 tablet by mouth daily. 04/12/14   Emily Filbert, MD   BP 120/83 mmHg  Pulse 82  Temp(Src) 98.5 F (36.9 C) (Oral)  Ht 5\' 9"  (1.753 m)  Wt 190 lb (86.183 kg)  BMI 28.05 kg/m2  SpO2 100%  LMP 03/07/2014 Physical Exam Nursing notes and Vital Signs reviewed. Appearance:  Patient appears stated age, and in no acute distress Eyes:  Pupils are equal, round, and reactive to light and accomodation.  Extraocular movement is intact.  Left conjunctivae normal.  Right conjunctivae mildly injected inferiorly.  No discharge present.  No photophobia. Ears:  Canals normal.  Tympanic membranes normal.  Nose:  Mildly congested turbinates.  No sinus tenderness.   Pharynx:  Normal Neck:  Supple.   No adenopathy Skin:  No rash present.   ED Course  Procedures  none     MDM   1. Allergic conjunctivitis, right     Continue Zyrtec, Flonase spray, and OTC ophthalmic antihistamine. Followup with Family Doctor if not improved in one week.  Kandra Nicolas, MD 04/23/14 [phone]

## 2014-04-18 NOTE — Discharge Instructions (Signed)
Continue Zyrtec, Flonase spray, and OTC ophthalmic antihistamine.   Allergic Conjunctivitis The conjunctiva is a thin membrane that covers the visible white part of the eyeball and the underside of the eyelids. This membrane protects and lubricates the eye. The membrane has small blood vessels running through it that can normally be seen. When the conjunctiva becomes inflamed, the condition is called conjunctivitis. In response to the inflammation, the conjunctival blood vessels become swollen. The swelling results in redness in the normally white part of the eye. The blood vessels of this membrane also react when a person has allergies and is then called allergic conjunctivitis. This condition usually lasts for as long as the allergy persists. Allergic conjunctivitis cannot be passed to another person (non-contagious). The likelihood of bacterial infection is great and the cause is not likely due to allergies if the inflamed eye has:  A sticky discharge.  Discharge or sticking together of the lids in the morning.  Scaling or flaking of the eyelids where the eyelashes come out.  Red swollen eyelids. CAUSES   Viruses.  Irritants such as foreign bodies.  Chemicals.  General allergic reactions.  Inflammation or serious diseases in the inside or the outside of the eye or the orbit (the boney cavity in which the eye sits) can cause a "red eye." SYMPTOMS   Eye redness.  Tearing.  Itchy eyes.  Burning feeling in the eyes.  Clear drainage from the eye.  Allergic reaction due to pollens or ragweed sensitivity. Seasonal allergic conjunctivitis is frequent in the spring when pollens are in the air and in the fall. DIAGNOSIS  This condition, in its many forms, is usually diagnosed based on the history and an ophthalmological exam. It usually involves both eyes. If your eyes react at the same time every year, allergies may be the cause. While most "red eyes" are due to allergy or an  infection, the role of an eye (ophthalmological) exam is important. The exam can rule out serious diseases of the eye or orbit. TREATMENT   Non-antibiotic eye drops, ointments, or medications by mouth may be prescribed if the ophthalmologist is sure the conjunctivitis is due to allergies alone.  Over-the-counter drops and ointments for allergic symptoms should be used only after other causes of conjunctivitis have been ruled out, or as your caregiver suggests. Medications by mouth are often prescribed if other allergy-related symptoms are present. If the ophthalmologist is sure that the conjunctivitis is due to allergies alone, treatment is normally limited to drops or ointments to reduce itching and burning. HOME CARE INSTRUCTIONS   Wash hands before and after applying drops or ointments, or touching the inflamed eye(s) or eyelids.  Do not let the eye dropper tip or ointment tube touch the eyelid when putting medicine in your eye.  Stop using your soft contact lenses and throw them away. Use a new pair of lenses when recovery is complete. You should run through sterilizing cycles at least three times before use after complete recovery if the old soft contact lenses are to be used. Hard contact lenses should be stopped. They need to be thoroughly sterilized before use after recovery.  Itching and burning eyes due to allergies is often relieved by using a cool cloth applied to closed eye(s). SEEK MEDICAL CARE IF:   Your problems do not go away after two or three days of treatment.  Your lids are sticky (especially in the morning when you wake up) or stick together.  Discharge develops. Antibiotics may be  needed either as drops, ointment, or by mouth.  You have extreme light sensitivity.  An oral temperature above 102 F (38.9 C) develops.  Pain in or around the eye or any other visual symptom develops. MAKE SURE YOU:   Understand these instructions.  Will watch your  condition.  Will get help right away if you are not doing well or get worse. Document Released: 06/23/2002 Document Revised: 06/25/2011 Document Reviewed: 05/19/2007 [facility] Patient Information 2015 [place], [place]. This information is not intended to replace advice given to you by your health care provider. Make sure you discuss any questions you have with your health care provider.

## 2014-04-18 NOTE — ED Notes (Signed)
Patient complains of red and swollen right eye, symptoms started Friday

## 2014-04-20 NOTE — Progress Notes (Signed)
   Subjective:    Patient ID: Mallory Rich, female    DOB: 10-27-1980, 34 y.o.   MRN: [id number]  HPI  Erroneous visit  Review of Systems     Objective:   Physical Exam        Assessment & Plan:

## 2014-04-22 ENCOUNTER — Encounter: Payer: Self-pay | Admitting: Family Medicine

## 2014-04-22 ENCOUNTER — Ambulatory Visit (INDEPENDENT_AMBULATORY_CARE_PROVIDER_SITE_OTHER): Payer: BC Managed Care – PPO | Admitting: Family Medicine

## 2014-04-22 VITALS — BP 145/89 | HR 92 | Temp 98.4°F | Wt 193.0 lb

## 2014-04-22 DIAGNOSIS — H00033 Abscess of eyelid right eye, unspecified eyelid: Secondary | ICD-10-CM

## 2014-04-22 DIAGNOSIS — L03213 Periorbital cellulitis: Secondary | ICD-10-CM

## 2014-04-22 MED ORDER — SULFAMETHOXAZOLE-TRIMETHOPRIM 800-160 MG PO TABS: ORAL_TABLET | ORAL | Status: AC

## 2014-04-22 MED ORDER — CEFDINIR 300 MG PO CAPS: 300.0000 mg | ORAL_CAPSULE | Freq: Two times a day (BID) | ORAL | Status: AC

## 2014-04-22 NOTE — Progress Notes (Signed)
CC: Mallory Rich is a 34 y.o. female is here for Facial Swelling   Subjective: HPI:  Redness of the right eye that began about a week ago that in the last 3 days has evolved into swelling of the right cheek, swelling just anterior to the right ear. The location just near the ear is painful but there is no pain elsewhere. She continues to have a red eye despite use of antihistamine eyedrops, oral antihistamines, and Tylenol. Other interventions as of yet. Symptoms seemed to slowly be worsening over the last 3 days now moderate in severity present all hours of the day. She has occasional tearing from the eye but no other discharge. She denies any recent or remote photophobia nor pain denies fevers, chills, nasal congestion, sore throat, shortness of breath night sweats nor postnasal drip.   Review Of Systems Outlined In HPI  Past Medical History  Diagnosis Date  . CHEST PAIN 08/10/2009    Qualifier: Diagnosis of  By: Stanford Breed, MD, Kandyce Rud   . CARDIAC MURMUR 08/10/2009    Qualifier: Diagnosis of  By: Stanford Breed, MD, Kandyce Rud   . Allergy     Past Surgical History  Procedure Laterality Date  . Wisdom teeth removal     Family History  Problem Relation Age of Onset  . Stroke Paternal Grandmother   . Melanoma Maternal Grandfather   . Stroke Maternal Grandmother     History   Social History  . Marital Status: Married    Spouse Name: N/A    Number of Children: N/A  . Years of Education: N/A   Occupational History  . teacer    Social History Main Topics  . Smoking status: Never Smoker   . Smokeless tobacco: Never Used  . Alcohol Use: No  . Drug Use: No  . Sexual Activity:    Partners: Male   Other Topics Concern  . Not on file   Social History Narrative     Objective: BP 145/89 mmHg  Pulse 92  Temp(Src) 98.4 F (36.9 C) (Oral)  Wt 193 lb (87.544 kg)  LMP 03/07/2014  General: Alert and Oriented, No Acute Distress HEENT: Pupils equal, round,  reactive to light. Left Conjunctivae clear but right nasal conjunctiva mildly erythematous. Anterior chambers are open without debris bilaterally..  External ears unremarkable, canals clear with intact TMs with appropriate landmarks.  Middle ear appears open without effusion. Pink inferior turbinates.  Moist mucous membranes, pharynx without inflammation nor lesions.  Neck supple without palpable lymphadenopathy nor abnormal masses. However slightly anterior to the right ear there is a single Tender lymph node approximately 5 mm in diameter. No pain with palpation of the submandibular or parotid gland. Mild facial swelling and erythema overlying the right maxillary sinus that extends upward into the lower eye lid.  Extremities: No peripheral edema.  Strong peripheral pulses.  Mental Status: No depression, anxiety, nor agitation. Skin: Warm and dry.  Assessment & Plan: Mallory Rich was seen today for facial swelling.  Diagnoses and associated orders for this visit:  Preseptal cellulitis of right eye - cefdinir (OMNICEF) 300 MG capsule; Take 1 capsule (300 mg total) by mouth 2 (two) times daily. - sulfamethoxazole-trimethoprim (SEPTRA DS) 800-160 MG per tablet; One by mouth twice a day for ten days.    Suspect preseptal cellulitis of the right eye which should respond nicely to [facility] and Septra, she has a relative intolerance to clindamycin. Discussed signs and symptoms including but not limited to fever, photophobia,  vision loss, ocular pain that would require emergent evaluation.  She has concerns regarding frequent illnesses since the fall she is wondering if there something wrong with her immune system. I discussed that labs would not be of high yield at this time since she is sick however when she is back in her regular state of health in 2-3 weeks she can call me and I be happy to put orders in for CBC with differential and TSH.  Return if symptoms worsen or fail to improve.

## 2014-04-23 ENCOUNTER — Ambulatory Visit: Payer: BC Managed Care – PPO | Admitting: Physician Assistant

## 2014-04-26 ENCOUNTER — Telehealth: Payer: Self-pay

## 2014-04-26 ENCOUNTER — Telehealth: Payer: Self-pay | Admitting: *Deleted

## 2014-04-26 DIAGNOSIS — R220 Localized swelling, mass and lump, head: Secondary | ICD-10-CM

## 2014-04-26 NOTE — Telephone Encounter (Signed)
Mallory Rich reports reduced swelling under her right eye but she still has the same amount of swelling on the right side of her face and around ear. Denies eye pain, fever, chills or sweats.

## 2014-04-26 NOTE — Telephone Encounter (Signed)
error 

## 2014-04-27 ENCOUNTER — Telehealth: Payer: Self-pay | Admitting: *Deleted

## 2014-04-27 ENCOUNTER — Ambulatory Visit (INDEPENDENT_AMBULATORY_CARE_PROVIDER_SITE_OTHER): Payer: BC Managed Care – PPO

## 2014-04-27 DIAGNOSIS — R220 Localized swelling, mass and lump, head: Secondary | ICD-10-CM

## 2014-04-27 MED ORDER — IOHEXOL 300 MG/ML  SOLN
75.0000 mL | Freq: Once | INTRAMUSCULAR | Status: AC | PRN
  Administered 2014-04-27: 75 mL via INTRAVENOUS

## 2014-04-27 NOTE — Telephone Encounter (Signed)
Pt.notified

## 2014-04-27 NOTE — Telephone Encounter (Signed)
CT approval for maxillofacial w/cm [id number] approved 04/27/14 thru 05/26/14. Radiology approved

## 2014-04-27 NOTE — Telephone Encounter (Signed)
Levada Dy or Seth Bake, Will you please let patient know that I would recommend having a CT of the face/ear  to look into why it's taking so long for this to resolve.  Order has been placed to be performed downstairs.

## 2014-05-04 ENCOUNTER — Ambulatory Visit (INDEPENDENT_AMBULATORY_CARE_PROVIDER_SITE_OTHER): Payer: BC Managed Care – PPO | Admitting: Physician Assistant

## 2014-05-04 ENCOUNTER — Telehealth: Payer: Self-pay | Admitting: Physician Assistant

## 2014-05-04 ENCOUNTER — Institutional Professional Consult (permissible substitution): Payer: BC Managed Care – PPO | Admitting: Sports Medicine

## 2014-05-04 ENCOUNTER — Encounter: Payer: Self-pay | Admitting: Physician Assistant

## 2014-05-04 VITALS — BP 139/88 | HR 81 | Ht 69.0 in | Wt 188.0 lb

## 2014-05-04 DIAGNOSIS — L03213 Periorbital cellulitis: Secondary | ICD-10-CM

## 2014-05-04 DIAGNOSIS — R599 Enlarged lymph nodes, unspecified: Secondary | ICD-10-CM

## 2014-05-04 DIAGNOSIS — R591 Generalized enlarged lymph nodes: Secondary | ICD-10-CM

## 2014-05-04 DIAGNOSIS — H00033 Abscess of eyelid right eye, unspecified eyelid: Secondary | ICD-10-CM | POA: Diagnosis not present

## 2014-05-04 NOTE — Telephone Encounter (Signed)
Pt stopped by. She is expecting an Ultrasound referral to be done and wants [place] to note on Referral that she prefers afternoon appts.  Thank you.

## 2014-05-04 NOTE — Progress Notes (Signed)
   Subjective:    Patient ID: Mallory Rich, female    DOB: 10-12-1980, 34 y.o.   MRN: [id number]  HPI Patient is a 34 year old female who presents to the clinic to follow-up after a 04/22/14 diagnosis of preseptal right arm cellulitis. CT confirmed left maxillary sinuisits but negative for any worrisome findings behind right eye. Patient finished Septra and Omnicef 2 days ago. She is feeling much better. Her eye redness has resolved. There is no residual swelling. There does remain a lymph node that is enlarged in front of her right ear that concerns her. No fever, chills, pain, nausea.   Review of Systems  All other systems reviewed and are negative.      Objective:   Physical Exam  Constitutional: She is oriented to person, place, and time. She appears well-developed and well-nourished.  HENT:  Head: Normocephalic and atraumatic.  Right Ear: External ear normal.  Left Ear: External ear normal.  Nose: Nose normal.  Mouth/Throat: Oropharynx is clear and moist. No oropharyngeal exudate.  TMs clear bilaterally.  No maxillary sinus tenderness to palpation bilaterally.  No facial swelling noted.    Eyes: Conjunctivae and EOM are normal. Pupils are equal, round, and reactive to light. Right eye exhibits no discharge. Left eye exhibits no discharge.  Neck: Normal range of motion. Neck supple.  There remains a nontender enlarged lymph nodes about 5 mm anterior to right ear.  Cardiovascular: Normal rate, regular rhythm and normal heart sounds.   Pulmonary/Chest: Effort normal and breath sounds normal.  Lymphadenopathy:    She has no cervical adenopathy.  Neurological: She is alert and oriented to person, place, and time.  Skin: Skin is dry.  Psychiatric: She has a normal mood and affect. Her behavior is normal.          Assessment & Plan:   Enlarged lymph node/peripheral cellulitis-cellulitis of face has resolved. Reassured patient. There does remain in the large lymph node.  Patient is very concerned about this. Discussed it can take 4-6 weeks for lymph nodes to resolve. It does not seem to have decreased in size. Will go ahead and get ultrasound to reassure patient. Will also order CBC just to look at lymphocyte neutrophil count. Discussed possible biopsy could be needed if not resolving in the next 4-6 weeks or for ultrasound revealed concerned.

## 2014-05-05 LAB — CBC WITH DIFFERENTIAL/PLATELET
Basophils Absolute: 0.1 10*3/uL (ref 0.0–0.1)
Basophils Relative: 1 % (ref 0–1)
Eosinophils Absolute: 0.1 10*3/uL (ref 0.0–0.7)
Eosinophils Relative: 1 % (ref 0–5)
HEMATOCRIT: 39.8 % (ref 36.0–46.0)
Hemoglobin: 13.7 g/dL (ref 12.0–15.0)
LYMPHS PCT: 39 % (ref 12–46)
Lymphs Abs: 2.3 10*3/uL (ref 0.7–4.0)
MCH: 28.7 pg (ref 26.0–34.0)
MCHC: 34.4 g/dL (ref 30.0–36.0)
MCV: 83.4 fL (ref 78.0–100.0)
MONOS PCT: 9 % (ref 3–12)
MPV: 9.9 fL (ref 8.6–12.4)
Monocytes Absolute: 0.5 10*3/uL (ref 0.1–1.0)
Neutro Abs: 2.9 10*3/uL (ref 1.7–7.7)
Neutrophils Relative %: 50 % (ref 43–77)
Platelets: 248 10*3/uL (ref 150–400)
RBC: 4.77 MIL/uL (ref 3.87–5.11)
RDW: 13.9 % (ref 11.5–15.5)
WBC: 5.8 10*3/uL (ref 4.0–10.5)

## 2014-05-05 NOTE — Telephone Encounter (Signed)
Done

## 2014-05-10 ENCOUNTER — Other Ambulatory Visit: Payer: BC Managed Care – PPO

## 2014-05-11 ENCOUNTER — Ambulatory Visit (INDEPENDENT_AMBULATORY_CARE_PROVIDER_SITE_OTHER): Payer: BC Managed Care – PPO

## 2014-05-11 ENCOUNTER — Encounter: Payer: Self-pay | Admitting: Physician Assistant

## 2014-05-11 DIAGNOSIS — R599 Enlarged lymph nodes, unspecified: Secondary | ICD-10-CM | POA: Insufficient documentation

## 2014-06-14 ENCOUNTER — Telehealth: Payer: Self-pay | Admitting: Physician Assistant

## 2014-06-14 DIAGNOSIS — R599 Enlarged lymph nodes, unspecified: Secondary | ICD-10-CM

## 2014-06-14 NOTE — Telephone Encounter (Signed)
-----   Message from Donella Stade, [place] sent at 05/11/2014  5:13 PM EST ----- Follow up u/s of right lymph node enlargment

## 2014-06-14 NOTE — Telephone Encounter (Signed)
I have a reminder for follow right lymph node enlargement. Is this ok to schedule?

## 2014-06-17 NOTE — Telephone Encounter (Signed)
LMOM for pt to return call. 

## 2014-06-21 NOTE — Telephone Encounter (Signed)
Pt left vm today stating that it is ok to order ultrasound.  Will place order.

## 2014-06-29 ENCOUNTER — Other Ambulatory Visit: Payer: BC Managed Care – PPO

## 2014-06-30 ENCOUNTER — Ambulatory Visit (INDEPENDENT_AMBULATORY_CARE_PROVIDER_SITE_OTHER): Payer: BC Managed Care – PPO

## 2014-06-30 DIAGNOSIS — R591 Generalized enlarged lymph nodes: Secondary | ICD-10-CM

## 2014-06-30 DIAGNOSIS — R599 Enlarged lymph nodes, unspecified: Secondary | ICD-10-CM

## 2014-08-04 ENCOUNTER — Encounter: Payer: Self-pay | Admitting: Physician Assistant

## 2014-08-04 ENCOUNTER — Ambulatory Visit (INDEPENDENT_AMBULATORY_CARE_PROVIDER_SITE_OTHER): Payer: BC Managed Care – PPO | Admitting: Physician Assistant

## 2014-08-04 VITALS — BP 135/86 | HR 77 | Ht 69.0 in | Wt 190.0 lb

## 2014-08-04 DIAGNOSIS — H6983 Other specified disorders of Eustachian tube, bilateral: Secondary | ICD-10-CM | POA: Diagnosis not present

## 2014-08-04 DIAGNOSIS — J302 Other seasonal allergic rhinitis: Secondary | ICD-10-CM | POA: Diagnosis not present

## 2014-08-04 DIAGNOSIS — H8110 Benign paroxysmal vertigo, unspecified ear: Secondary | ICD-10-CM | POA: Insufficient documentation

## 2014-08-04 DIAGNOSIS — H8111 Benign paroxysmal vertigo, right ear: Secondary | ICD-10-CM

## 2014-08-04 DIAGNOSIS — R300 Dysuria: Secondary | ICD-10-CM | POA: Diagnosis not present

## 2014-08-04 DIAGNOSIS — R319 Hematuria, unspecified: Secondary | ICD-10-CM

## 2014-08-04 LAB — POCT URINALYSIS DIPSTICK
BILIRUBIN UA: NEGATIVE
GLUCOSE UA: NEGATIVE
Ketones, UA: NEGATIVE
LEUKOCYTES UA: NEGATIVE
NITRITE UA: NEGATIVE
Protein, UA: 30
Spec Grav, UA: >=1.03
UROBILINOGEN UA: 0.2
pH, UA: 5.5

## 2014-08-04 MED ORDER — PREDNISONE 20 MG PO TABS: ORAL_TABLET | ORAL | Status: DC

## 2014-08-04 MED ORDER — CIPROFLOXACIN HCL 500 MG PO TABS: 500.0000 mg | ORAL_TABLET | Freq: Two times a day (BID) | ORAL | Status: DC

## 2014-08-04 MED ORDER — MECLIZINE HCL 50 MG PO TABS: 50.0000 mg | ORAL_TABLET | Freq: Three times a day (TID) | ORAL | Status: DC | PRN

## 2014-08-04 NOTE — Patient Instructions (Signed)
Do epley exercises.  Prednisone taper.  antivert for dizziness/nausea Add back flonase, stay on zyrtec.

## 2014-08-04 NOTE — Progress Notes (Signed)
   Subjective:    Patient ID: Mallory Rich, female    DOB: 11-Sep-1980, 34 y.o.   MRN: [id number]  HPI  Pt presents to the clinic with 2 weeks of off and on pain in bilateral ears. Right worse than left. Sometimes the pain in right ear goes down her neck. For last couple of days she has been very nauseated but no vomiting. She now is getting dizzy sensation when she turns her head. On zyrtec not been using flonase. Hx of multiple allergies and needing shots but too expensive. She has a lot of pressure in ears. decongesants do help but comes right back and dries her out very much. No fever, cough, SOB, or wheezing.    She has had some dysuria and increased urination. Home urine test showed UTI. Symptoms for last couple of days. Not tried anything to make better.   Review of Systems  All other systems reviewed and are negative.      Objective:   Physical Exam  Constitutional: She is oriented to person, place, and time. She appears well-developed and well-nourished.  HENT:  Head: Normocephalic and atraumatic.  Mouth/Throat: Oropharynx is clear and moist. No oropharyngeal exudate.  TM's erythematous with some fluid behind both TM's. No sign of infection.   Bilateral nares red and swollen.   Eyes:  Bilateral injected conjunctiva with watery discharge.   Neck: Normal range of motion. Neck supple.  Cardiovascular: Normal rate, regular rhythm and normal heart sounds.   Pulmonary/Chest: Effort normal and breath sounds normal. She has no wheezes.  Lymphadenopathy:    She has no cervical adenopathy.  Neurological: She is alert and oriented to person, place, and time.  dix hallpike positive to the right only.   Skin: Skin is dry.  Psychiatric: She has a normal mood and affect. Her behavior is normal.          Assessment & Plan:  BPV, right/seasonal allergies- HO for epley manuevers given today. Prednisone taper given. antivert for nausea and dizziness. Discussed proper use of flonase.  Continue zyrtec. If not improving need to consider other ways to control allergy symptoms. Discussed trying qnasl next with singular added to zyrtec. Hx of needing allergy shots but could not afford them.    Dysuria/hematuria- . Results for orders placed or performed in visit on 08/04/14  POCT urinalysis dipstick  Result Value Ref Range   Color, UA orange    Clarity, UA clear    Glucose, UA neg    Bilirubin, UA neg    Ketones, UA neg    Spec Grav, UA >=1.030    Blood, UA moderate    pH, UA 5.5    Protein, UA 30    Urobilinogen, UA 0.2    Nitrite, UA neg    Leukocytes, UA Negative    Will culture. Does have blood and protein with symptoms. Will treat with cipro. Discussed proper hydration. Given HO for UTI.

## 2014-08-07 LAB — URINE CULTURE

## 2014-08-11 ENCOUNTER — Ambulatory Visit (INDEPENDENT_AMBULATORY_CARE_PROVIDER_SITE_OTHER): Payer: BC Managed Care – PPO | Admitting: Physician Assistant

## 2014-08-11 VITALS — BP 142/83 | HR 85 | Wt 192.0 lb

## 2014-08-11 DIAGNOSIS — R300 Dysuria: Secondary | ICD-10-CM | POA: Diagnosis not present

## 2014-08-11 LAB — POCT URINALYSIS DIPSTICK
BILIRUBIN UA: NEGATIVE
Glucose, UA: NEGATIVE
Ketones, UA: NEGATIVE
LEUKOCYTES UA: NEGATIVE
NITRITE UA: NEGATIVE
PH UA: 5.5
PROTEIN UA: NEGATIVE
Spec Grav, UA: >=1.03
Urobilinogen, UA: 0.2

## 2014-08-11 NOTE — Progress Notes (Signed)
Patient ID: TWYLIA OKA, female   DOB: [birth year], 33 y.o.   MRN: [id number]   Patient states she is still having symptoms after she has completed the antibiotics    Dysuria- .. Results for orders placed or performed in visit on 08/11/14  POCT urinalysis dipstick  Result Value Ref Range   Color, UA yellow    Clarity, UA clear    Glucose, UA negative    Bilirubin, UA negative    Ketones, UA negative    Spec Grav, UA >=1.030    Blood, UA small    pH, UA 5.5    Protein, UA negative    Urobilinogen, UA 0.2    Nitrite, UA negative    Leukocytes, UA Negative    Small blood but protein resolved and no nitrites or leukocytes. Will wait for culture results to treat.

## 2014-08-12 ENCOUNTER — Telehealth: Payer: Self-pay

## 2014-08-12 NOTE — Progress Notes (Signed)
Left VM leeting patient know that her urine on yesterday 4/27: Will get culture. Can call pt and let know no nitrates, leuks or protein. Only small blood. Are symptoms more urinary frequency or pain?

## 2014-08-12 NOTE — Telephone Encounter (Signed)
Result note closed, see note below.   Notes Recorded by Donella Stade, PA-C on 08/11/2014 at 5:11 PM Will get culture. Can call pt and let know no nitrates, leuks or protein. Only small blood. Are symptoms more urinary frequency or pain?   She is having frequent urination and increased night time urination.

## 2014-08-13 LAB — URINE CULTURE
Colony Count: NO GROWTH
Organism ID, Bacteria: NO GROWTH

## 2014-08-13 NOTE — Telephone Encounter (Signed)
This could represent some OAB symptoms. Waiting for culture. Make sure symptoms not worsening to include pain or fever.

## 2014-08-17 NOTE — Telephone Encounter (Signed)
Advised Pt of recommendation regarding OTC Azo. Pt states she will try this, and if still having symptoms advised to contact office. Verbalized understanding, no further questions.

## 2014-08-20 ENCOUNTER — Encounter: Payer: Self-pay | Admitting: Physician Assistant

## 2014-08-20 ENCOUNTER — Ambulatory Visit (INDEPENDENT_AMBULATORY_CARE_PROVIDER_SITE_OTHER): Payer: BC Managed Care – PPO | Admitting: Physician Assistant

## 2014-08-20 VITALS — BP 128/80 | HR 80 | Wt 191.0 lb

## 2014-08-20 DIAGNOSIS — Z9109 Other allergy status, other than to drugs and biological substances: Secondary | ICD-10-CM

## 2014-08-20 DIAGNOSIS — J302 Other seasonal allergic rhinitis: Secondary | ICD-10-CM

## 2014-08-20 DIAGNOSIS — R0683 Snoring: Secondary | ICD-10-CM | POA: Diagnosis not present

## 2014-08-20 DIAGNOSIS — R5383 Other fatigue: Secondary | ICD-10-CM | POA: Diagnosis not present

## 2014-08-20 DIAGNOSIS — Z91048 Other nonmedicinal substance allergy status: Secondary | ICD-10-CM

## 2014-08-20 DIAGNOSIS — R635 Abnormal weight gain: Secondary | ICD-10-CM

## 2014-08-20 DIAGNOSIS — E663 Overweight: Secondary | ICD-10-CM

## 2014-08-20 MED ORDER — MONTELUKAST SODIUM 10 MG PO TABS: 10.0000 mg | ORAL_TABLET | Freq: Every day | ORAL | Status: DC

## 2014-08-20 NOTE — Patient Instructions (Addendum)
Fatigue Fatigue is a feeling of tiredness, lack of energy, lack of motivation, or feeling tired all the time. Having enough rest, good nutrition, and reducing stress will normally reduce fatigue. Consult your caregiver if it persists. The nature of your fatigue will help your caregiver to find out its cause. The treatment is based on the cause.  CAUSES  There are many causes for fatigue. Most of the time, fatigue can be traced to one or more of your habits or routines. Most causes fit into one or more of three general areas. They are: Lifestyle problems  Sleep disturbances.  Overwork.  Physical exertion.  Unhealthy habits.  Poor eating habits or eating disorders.  Alcohol and/or drug use .  Lack of proper nutrition (malnutrition). Psychological problems  Stress and/or anxiety problems.  Depression.  Grief.  Boredom. Medical Problems or Conditions  Anemia.  Pregnancy.  Thyroid gland problems.  Recovery from major surgery.  Continuous pain.  Emphysema or asthma that is not well controlled  Allergic conditions.  Diabetes.  Infections (such as mononucleosis).  Obesity.  Sleep disorders, such as sleep apnea.  Heart failure or other heart-related problems.  Cancer.  Kidney disease.  Liver disease.  Effects of certain medicines such as antihistamines, cough and cold remedies, prescription pain medicines, heart and blood pressure medicines, drugs used for treatment of cancer, and some antidepressants. SYMPTOMS  The symptoms of fatigue include:   Lack of energy.  Lack of drive (motivation).  Drowsiness.  Feeling of indifference to the surroundings. DIAGNOSIS  The details of how you feel help guide your caregiver in finding out what is causing the fatigue. You will be asked about your present and past health condition. It is important to review all medicines that you take, including prescription and non-prescription items. A thorough exam will be done.  You will be questioned about your feelings, habits, and normal lifestyle. Your caregiver may suggest blood tests, urine tests, or other tests to look for common medical causes of fatigue.  TREATMENT  Fatigue is treated by correcting the underlying cause. For example, if you have continuous pain or depression, treating these causes will improve how you feel. Similarly, adjusting the dose of certain medicines will help in reducing fatigue.  HOME CARE INSTRUCTIONS   Try to get the required amount of good sleep every night.  Eat a healthy and nutritious diet, and drink enough water throughout the day.  Practice ways of relaxing (including yoga or meditation).  Exercise regularly.  Make plans to change situations that cause stress. Act on those plans so that stresses decrease over time. Keep your work and personal routine reasonable.  Avoid street drugs and minimize use of alcohol.  Start taking a daily multivitamin after consulting your caregiver. SEEK MEDICAL CARE IF:   You have persistent tiredness, which cannot be accounted for.  You have fever.  You have unintentional weight loss.  You have headaches.  You have disturbed sleep throughout the night.  You are feeling sad.  You have constipation.  You have dry skin.  You have gained weight.  You are taking any new or different medicines that you suspect are causing fatigue.  You are unable to sleep at night.  You develop any unusual swelling of your legs or other parts of your body. SEEK IMMEDIATE MEDICAL CARE IF:   You are feeling confused.  Your vision is blurred.  You feel faint or pass out.  You develop severe headache.  You develop severe abdominal, pelvic, or   back pain.  You develop chest pain, shortness of breath, or an irregular or fast heartbeat.  You are unable to pass a normal amount of urine.  You develop abnormal bleeding such as bleeding from the rectum or you vomit blood.  You have thoughts  about harming yourself or committing suicide.  You are worried that you might harm someone else. MAKE SURE YOU:   Understand these instructions.  Will watch your condition.  Will get help right away if you are not doing well or get worse. Document Released: 01/28/2007 Document Revised: 06/25/2011 Document Reviewed: 08/04/2013 [facility] Patient Information 2015 [place], [place]. This information is not intended to replace advice given to you by your health care provider. Make sure you discuss any questions you have with your health care provider.   Local honey.  Beta glucan 1 capsule twice a day.  Gluten 2 weeks.  Vents are clean.

## 2014-08-20 NOTE — Progress Notes (Signed)
   Subjective:    Patient ID: Mallory Rich, female    DOB: 03/20/1981, 34 y.o.   MRN: [id number]  HPI  Pt presents to the clinic just not feeling good. She has lots of watery, itchy, eyes, dry cough, sinus pressure, and fatigue. She can go to sleep and sleep for 12 hours but wakes up numerous times and very tired next morning. She does admit to snoring. Gained 19lbs over past year. She feels run down since December. No known fever, chills. Denies any depression or feelings of hopelessness or helplessness. No fever, chills or wheezing. Taking zyrtec and flonase.    Review of Systems  All other systems reviewed and are negative.      Objective:   Physical Exam  Constitutional: She is oriented to person, place, and time. She appears well-developed and well-nourished.  Overweight.   HENT:  Head: Normocephalic and atraumatic.  tM's a little erythematous with what appears to be a slight bulge due to fluid.  Oropharynx erythematous with PND no tonsilar swelling or exudate.  Bilateral nares red and swollen.  Some slight tenderness over maxillary sinuses.  Neck: Normal range of motion. Neck supple.  Cardiovascular: Normal rate, regular rhythm and normal heart sounds.   Pulmonary/Chest: Effort normal and breath sounds normal. She has no wheezes.  Lymphadenopathy:    She has no cervical adenopathy.  Neurological: She is alert and oriented to person, place, and time.  Skin: Skin is dry.  Bilateral flushed cheeks.   Psychiatric: She has a normal mood and affect. Her behavior is normal.          Assessment & Plan:  Multiple allergies- certainly i feel like allergies are playing a huge role in how pt feels. We are going to also screen for food allergies. Pt qualifies for immunotherapy but cannot afford shots. We will add singulair to flonase and zyrtec today. Consider local honey, beta glucan. Could even consider gluten free and see if it could contribute.   Fatigue- discussed there are  multiple causes for fatigue. I do not see evidence of active infection today. Per pt she has gained 19lbs and snores. She also does not wake up rested. Will get sleep apnea study done. Cannot exclude posibility of mood, allergies, or diet causing fatigue. Ordered TSH, ferritin, CBC, b12, vitamin D and food allergy panel.

## 2014-08-21 LAB — CBC WITH DIFFERENTIAL/PLATELET
Basophils Absolute: 0 10*3/uL (ref 0.0–0.1)
Basophils Relative: 0 % (ref 0–1)
EOS ABS: 0.1 10*3/uL (ref 0.0–0.7)
Eosinophils Relative: 1 % (ref 0–5)
HEMATOCRIT: 39.4 % (ref 36.0–46.0)
HEMOGLOBIN: 13.2 g/dL (ref 12.0–15.0)
LYMPHS ABS: 2.3 10*3/uL (ref 0.7–4.0)
Lymphocytes Relative: 25 % (ref 12–46)
MCH: 28.6 pg (ref 26.0–34.0)
MCHC: 33.5 g/dL (ref 30.0–36.0)
MCV: 85.5 fL (ref 78.0–100.0)
MONO ABS: 0.5 10*3/uL (ref 0.1–1.0)
MONOS PCT: 6 % (ref 3–12)
MPV: 10 fL (ref 8.6–12.4)
NEUTROS PCT: 68 % (ref 43–77)
Neutro Abs: 6.2 10*3/uL (ref 1.7–7.7)
Platelets: 323 10*3/uL (ref 150–400)
RBC: 4.61 MIL/uL (ref 3.87–5.11)
RDW: 14.2 % (ref 11.5–15.5)
WBC: 9.1 10*3/uL (ref 4.0–10.5)

## 2014-08-21 LAB — VITAMIN D 25 HYDROXY (VIT D DEFICIENCY, FRACTURES): Vit D, 25-Hydroxy: 29 ng/mL — ABNORMAL LOW (ref 30–100)

## 2014-08-21 LAB — TSH: TSH: 0.995 u[IU]/mL (ref 0.350–4.500)

## 2014-08-21 LAB — VITAMIN B12: VITAMIN B 12: 332 pg/mL (ref 211–911)

## 2014-08-21 LAB — FERRITIN: FERRITIN: 85 ng/mL (ref 10–291)

## 2014-08-23 ENCOUNTER — Telehealth: Payer: Self-pay | Admitting: Physician Assistant

## 2014-08-23 ENCOUNTER — Other Ambulatory Visit: Payer: Self-pay | Admitting: Physician Assistant

## 2014-08-23 DIAGNOSIS — R0683 Snoring: Secondary | ICD-10-CM

## 2014-08-23 DIAGNOSIS — E663 Overweight: Secondary | ICD-10-CM

## 2014-08-23 DIAGNOSIS — R5383 Other fatigue: Secondary | ICD-10-CM

## 2014-08-23 NOTE — Telephone Encounter (Signed)
Ok done

## 2014-08-23 NOTE — Telephone Encounter (Signed)
Re:  Referral for Sleep Test. Please put in an Order for sleep test because they are done as orders and  not referrals. Thank you.

## 2014-08-25 ENCOUNTER — Telehealth: Payer: Self-pay | Admitting: *Deleted

## 2014-08-25 DIAGNOSIS — R0683 Snoring: Secondary | ICD-10-CM

## 2014-08-25 NOTE — Telephone Encounter (Signed)
Pt has [employer] and this insurance will not approve a Home Sleep Test per Terri with [facility]. Please put in an Order for an in  Lab test instead.   Please also put a copy of new Order in [place] [facility] [employer]. Thank you.

## 2014-08-25 NOTE — Telephone Encounter (Signed)
Done

## 2014-08-26 LAB — IGG FOOD PANEL
ALLERGEN EGG WHITE IGG: 6.2 ug/mL — AB (ref <2.0)
Allergen, Milk, IgG: 10.9 ug/mL — ABNORMAL HIGH (ref <0.15)
Beef, IgG: 6.2 ug/mL — ABNORMAL HIGH (ref <2.0)
Corn, IgG: <0.15 ug/mL (ref <0.15)
Egg yolk, IgG: 4.7 ug/mL — ABNORMAL HIGH (ref <2.0)
Peanut, IgG: 0.22 ug/mL — ABNORMAL HIGH (ref <0.15)
Wheat, IgG: 0.78 ug/mL — ABNORMAL HIGH (ref <0.15)

## 2014-08-29 ENCOUNTER — Encounter (HOSPITAL_BASED_OUTPATIENT_CLINIC_OR_DEPARTMENT_OTHER): Payer: BC Managed Care – PPO

## 2014-09-08 ENCOUNTER — Ambulatory Visit (INDEPENDENT_AMBULATORY_CARE_PROVIDER_SITE_OTHER): Payer: BC Managed Care – PPO | Admitting: Family Medicine

## 2014-09-08 ENCOUNTER — Encounter: Payer: Self-pay | Admitting: Family Medicine

## 2014-09-08 VITALS — BP 142/92 | HR 79 | Wt 193.0 lb

## 2014-09-08 DIAGNOSIS — E559 Vitamin D deficiency, unspecified: Secondary | ICD-10-CM

## 2014-09-08 DIAGNOSIS — R319 Hematuria, unspecified: Secondary | ICD-10-CM

## 2014-09-08 DIAGNOSIS — Z91018 Allergy to other foods: Secondary | ICD-10-CM

## 2014-09-08 MED ORDER — VITAMIN D (ERGOCALCIFEROL) 1.25 MG (50000 UNIT) PO CAPS: 50000.0000 [IU] | ORAL_CAPSULE | ORAL | Status: DC

## 2014-09-08 NOTE — Progress Notes (Signed)
CC: Mallory Rich is a 34 y.o. female is here for discuss allergy labs   Subjective: HPI:  Follow-up vitamin D deficiency: She had labs done earlier this month with a mild vitamin D deficiency. She is currently taking 800 units of vitamin D daily. She's gotten a little bit of her energy back but she attributes this to rain that wiped out all accounts last week and improved her allergic upper respiratory complaints.  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 She tells me that the only thing that she really consumes out of this is wheat.  There's been no diarrhea constipation or blood in stool. No unintentional weight loss. She wants know what she is post to do with this information.  She tells me that in late April she was found to have blood in her urine while suffering from a UTI. She had a repeat urinalysis showing evidence of blood after the UTI was treated. She wants know if there is any workup that needs to be done to address this. She denies any gross blood in her urine. No flank pain or genitourinary complaints other than blood in urine tests.   Review Of Systems Outlined In HPI  Past Medical History  Diagnosis Date  . CHEST PAIN 08/10/2009    Qualifier: Diagnosis of  By: Stanford Breed, MD, Kandyce Rud   . CARDIAC MURMUR 08/10/2009    Qualifier: Diagnosis of  By: Stanford Breed, MD, Kandyce Rud   . Allergy     Past Surgical History  Procedure Laterality Date  . Wisdom teeth removal     Family History  Problem Relation Age of Onset  . Stroke Paternal Grandmother   . Melanoma Maternal Grandfather   . Stroke Maternal Grandmother     History   Social History  . Marital Status: Married    Spouse Name: N/A  . Number of Children: N/A  . Years of Education: N/A   Occupational History  . teacer    Social History Main Topics  . Smoking status: Never Smoker   . Smokeless tobacco: Never Used  .  Alcohol Use: No  . Drug Use: No  . Sexual Activity:    Partners: Male   Other Topics Concern  . Not on file   Social History Narrative     Objective: BP 142/92 mmHg  Pulse 79  Wt 193 lb (87.544 kg)  Vital signs reviewed. General: Alert and Oriented, No Acute Distress HEENT: Pupils equal, round, reactive to light. Conjunctivae clear.  External ears unremarkable.  Moist mucous membranes. Lungs: Clear and comfortable work of breathing, speaking in full sentences without accessory muscle use. Cardiac: Regular rate and rhythm.  Neuro: CN II-XII grossly intact, gait normal. Extremities: No peripheral edema.  Strong peripheral pulses.  Mental Status: No depression, anxiety, nor agitation. Logical though process. Skin: Warm and dry. Assessment & Plan: Mallory Rich was seen today for discuss allergy labs.  Diagnoses and all orders for this visit:  Vitamin D deficiency Orders: -     Vitamin D, Ergocalciferol, (DRISDOL) 50000 UNITS CAPS capsule; Take 1 capsule (50,000 Units total) by mouth every 7 (seven) days. Recheck Vitamin D in 3 Months  Food allergy  Hematuria Orders: -     Cancel: Urinalysis, Routine w reflex microscopic (not at [facility]) -     Urinalysis, Routine w reflex microscopic (not at [facility])   Vitamin D deficiency: Offered weekly vitamin D supplementation  to speed up her replacement therapy in place of daily 800 units of vitamin D. Food allergy: Discussed that her IgG tests are not 100% accurate about reflecting true allergy to what she tested positive for. It's worth starting an elimination diet by removing all of the allergens that she tested positive for and every 2 weeks add back 1 of these allergens into the diet. This allergen should be stopped and avoided in the future if any gastrointestinal symptoms or fatigue returns. Joint decision for referral to allergist Checking urinalysis and an reflex microscopy to help quantify the amount of blood in her urine.  No Follow-up  on file.

## 2014-09-09 LAB — URINALYSIS, ROUTINE W REFLEX MICROSCOPIC
Bilirubin Urine: NEGATIVE
GLUCOSE, UA: NEGATIVE mg/dL
HGB URINE DIPSTICK: NEGATIVE
Ketones, ur: NEGATIVE mg/dL
Leukocytes, UA: NEGATIVE
Nitrite: NEGATIVE
Protein, ur: NEGATIVE mg/dL
SPECIFIC GRAVITY, URINE: 1.009 (ref 1.005–1.030)
Urobilinogen, UA: 0.2 mg/dL (ref 0.0–1.0)
pH: 5.5 (ref 5.0–8.0)

## 2014-10-14 ENCOUNTER — Encounter (HOSPITAL_BASED_OUTPATIENT_CLINIC_OR_DEPARTMENT_OTHER): Payer: BC Managed Care – PPO

## 2015-03-01 ENCOUNTER — Encounter: Payer: Self-pay | Admitting: Physician Assistant

## 2015-03-01 ENCOUNTER — Ambulatory Visit (INDEPENDENT_AMBULATORY_CARE_PROVIDER_SITE_OTHER): Payer: BC Managed Care – PPO | Admitting: Physician Assistant

## 2015-03-01 ENCOUNTER — Ambulatory Visit (INDEPENDENT_AMBULATORY_CARE_PROVIDER_SITE_OTHER): Payer: BC Managed Care – PPO

## 2015-03-01 VITALS — BP 117/75 | HR 74 | Ht 69.0 in | Wt 190.0 lb

## 2015-03-01 DIAGNOSIS — M79641 Pain in right hand: Secondary | ICD-10-CM

## 2015-03-01 DIAGNOSIS — R200 Anesthesia of skin: Secondary | ICD-10-CM

## 2015-03-01 DIAGNOSIS — R202 Paresthesia of skin: Secondary | ICD-10-CM | POA: Diagnosis not present

## 2015-03-01 MED ORDER — TRIAMCINOLONE ACETONIDE 0.1 % EX CREA: 1.0000 "application " | TOPICAL_CREAM | Freq: Two times a day (BID) | CUTANEOUS | Status: DC

## 2015-03-01 MED ORDER — MELOXICAM 15 MG PO TABS: 15.0000 mg | ORAL_TABLET | Freq: Every day | ORAL | Status: DC

## 2015-03-02 NOTE — Progress Notes (Signed)
   Subjective:    Patient ID: Mallory Rich, female    DOB: 05/06/1980, 34 y.o.   MRN: [id number]  HPI Pt is a 34 yo female who presents to the clinic to discuss right arm and hand numbness for last week. She was getting ready for a birthday party on Sunday and symptoms started the next day. She does not remember any acutal injury. Now there is pain when she twist her right wrist and into her 5th metatarsal. Makes difficult to write.At times pain radiates path of ulnar nerve. Wearing a brace that she had which helps not tried anything else.    Review of Systems  All other systems reviewed and are negative.      Objective:   Physical Exam  Constitutional: She is oriented to person, place, and time. She appears well-developed and well-nourished.  HENT:  Head: Normocephalic and atraumatic.  Musculoskeletal:  Normal range of motion and strength of right wrist and hand.  Slight swelling noted of right hand compared to left but diffuse. No pain with palpation over bony landmarks of right wrist and phalanges. No pain over epicondyle of right wrist.   Negative finklestein, phales and tinels.   Neurological: She is alert and oriented to person, place, and time.  Psychiatric: She has a normal mood and affect. Her behavior is normal.          Assessment & Plan:  Right hand pain/numbness and tingling- could certainly be some radiculopathy from neck. Symptoms are not consistent with carpel tunnel. Let's get xray of hand first then may consider xray of neck +or - prednisone. Certainly sounds like could be some tendonitis of wrist. Start with immobilization with brace she already has and mobic for next 2 weeks. Exercise of ROM discussed with patient. Ice area. Follow up as needed.

## 2015-05-10 ENCOUNTER — Encounter: Payer: Self-pay | Admitting: Physician Assistant

## 2015-05-10 ENCOUNTER — Ambulatory Visit (INDEPENDENT_AMBULATORY_CARE_PROVIDER_SITE_OTHER): Payer: BC Managed Care – PPO | Admitting: Physician Assistant

## 2015-05-10 VITALS — BP 129/69 | HR 98 | Temp 97.6°F | Ht 69.0 in | Wt 193.0 lb

## 2015-05-10 DIAGNOSIS — R05 Cough: Secondary | ICD-10-CM | POA: Diagnosis not present

## 2015-05-10 DIAGNOSIS — R059 Cough, unspecified: Secondary | ICD-10-CM

## 2015-05-10 DIAGNOSIS — R079 Chest pain, unspecified: Secondary | ICD-10-CM | POA: Diagnosis not present

## 2015-05-10 DIAGNOSIS — Z91048 Other nonmedicinal substance allergy status: Secondary | ICD-10-CM | POA: Diagnosis not present

## 2015-05-10 DIAGNOSIS — L600 Ingrowing nail: Secondary | ICD-10-CM

## 2015-05-10 DIAGNOSIS — Z91018 Allergy to other foods: Secondary | ICD-10-CM

## 2015-05-10 DIAGNOSIS — Z9109 Other allergy status, other than to drugs and biological substances: Secondary | ICD-10-CM

## 2015-05-10 MED ORDER — MONTELUKAST SODIUM 10 MG PO TABS: 10.0000 mg | ORAL_TABLET | Freq: Every day | ORAL | Status: DC

## 2015-05-10 MED ORDER — RANITIDINE HCL 150 MG PO TABS: 150.0000 mg | ORAL_TABLET | Freq: Two times a day (BID) | ORAL | Status: AC

## 2015-05-10 MED ORDER — DOXYCYCLINE HYCLATE 100 MG PO TABS: 100.0000 mg | ORAL_TABLET | Freq: Two times a day (BID) | ORAL | Status: DC

## 2015-05-10 NOTE — Progress Notes (Signed)
   Subjective:    Patient ID: Mallory Rich, female    DOB: Jul 29, 1980, 35 y.o.   MRN: [id number]  HPI  Pt is a 35 yo female who presents to the clinic with right-sided chest discomfort for the last week. Pain is not constant but every night when she goes to bed and lays down. Sometimes is a little worse when she wakes up in. Pathology is vertical she does not have chest pain. She does have a dry cough. She does have some head congestion and a lot of drainage. Drainage is mostly clear. She has tried Zyrtec with little relief. She is not taking her Singulair. She has a history of food and environmental allergies. She is a candidate for allergy shots but she does not want them.   Patient has a tender left cuticle for the last few weeks. Is getting redder. She has occasional discharge. She has been doing warm water soaks. Denies fever, chills, nausea or vomiting.   Review of Systems  All other systems reviewed and are negative.      Objective:   Physical Exam  Constitutional: She is oriented to person, place, and time. She appears well-developed and well-nourished.  HENT:  Head: Normocephalic and atraumatic.  Cardiovascular: Normal rate, regular rhythm and normal heart sounds.   Pulmonary/Chest: Effort normal and breath sounds normal. She has no wheezes.  Musculoskeletal:  Left lateral redness with some active pus around nail edge.   Neurological: She is alert and oriented to person, place, and time.  Skin: Skin is dry.  Psychiatric: She has a normal mood and affect. Her behavior is normal.          Assessment & Plan:  Multiple food and environmental allergies/ cough/ right side chest pain- unclear etiology but suspect due to allergies vs some GERD. start zantac 150mg  twice a day this could also help with overall allergies. Discussed the power of food allergies and GERD/allergy symptoms. I suggested a detox diet of all the things she is allergic to. I would keep to vegetables,  chicken, corn for at least 2 weeks then slowly add back in food. Restart Singulair. Was see if symptoms improve with treating acid reflux. I do not think the right-sided chest pain is a cardiac cause. I definitely think there is a GERD component as well. Continue Flonase. Follow-up in the next few weeks if no improvement.   Left ingrown toenail infected- treated with doxycycline. Not enough time for removal. epson salt soaks with abx. Follow up in 1 week for removal.

## 2015-05-11 DIAGNOSIS — R05 Cough: Secondary | ICD-10-CM | POA: Insufficient documentation

## 2015-05-11 DIAGNOSIS — R059 Cough, unspecified: Secondary | ICD-10-CM | POA: Insufficient documentation

## 2015-05-11 DIAGNOSIS — Z91018 Allergy to other foods: Secondary | ICD-10-CM | POA: Insufficient documentation

## 2015-05-11 DIAGNOSIS — L600 Ingrowing nail: Secondary | ICD-10-CM | POA: Insufficient documentation

## 2015-05-11 DIAGNOSIS — R079 Chest pain, unspecified: Secondary | ICD-10-CM | POA: Insufficient documentation

## 2015-05-11 DIAGNOSIS — Z9109 Other allergy status, other than to drugs and biological substances: Secondary | ICD-10-CM | POA: Insufficient documentation

## 2015-05-23 ENCOUNTER — Other Ambulatory Visit: Payer: Self-pay | Admitting: *Deleted

## 2015-05-23 DIAGNOSIS — Z3041 Encounter for surveillance of contraceptive pills: Secondary | ICD-10-CM

## 2015-05-23 MED ORDER — LEVONORGEST-ETH ESTRAD 91-DAY 0.15-0.03 MG PO TABS: 1.0000 | ORAL_TABLET | Freq: Every day | ORAL | Status: DC

## 2015-05-23 NOTE — Telephone Encounter (Signed)
Pt has made appt with Dr Hulan Fray and 1 pack of OCP's were sent to [facility]

## 2015-05-24 ENCOUNTER — Telehealth: Payer: Self-pay | Admitting: *Deleted

## 2015-05-26 NOTE — Telephone Encounter (Signed)
Patient states she started vomiting and had nausea. She stopped taking the doxycycline and the Singulair because she didn't know which one was causing the nausea and vomiting. She took them both together with food. Please advise.

## 2015-05-27 NOTE — Telephone Encounter (Signed)
Stop doxy.  If she needs zofran for nausea ok to send 4mg  i po q-4 to 6 hours #20 NRF

## 2015-05-27 NOTE — Telephone Encounter (Signed)
Pt advised of recommendation to stop Doxy. No nausea Rx required, as soon as she stopped the Rx the nausea subsided. Will add doxy as an intolerance on allergy list.

## 2015-05-27 NOTE — Telephone Encounter (Signed)
Pt states she has about 4 days worth of Doxycycline left. States she was taking the Rx for about a week, and questions if she even needs to try taking it again. Advised to restart the Singulair, states she will.

## 2015-05-27 NOTE — Telephone Encounter (Signed)
anitibiotics in general can cause nausea and vomiting. singulair less likely. Stomach bug is also going around so if these were acute symptoms cannot rule that out. How much more of doxy does she have?

## 2015-06-06 ENCOUNTER — Encounter: Payer: Self-pay | Admitting: Obstetrics & Gynecology

## 2015-06-06 ENCOUNTER — Ambulatory Visit (INDEPENDENT_AMBULATORY_CARE_PROVIDER_SITE_OTHER): Payer: BC Managed Care – PPO | Admitting: Obstetrics & Gynecology

## 2015-06-06 VITALS — BP 120/72 | HR 76 | Resp 16 | Ht 69.0 in | Wt 192.0 lb

## 2015-06-06 DIAGNOSIS — Z1151 Encounter for screening for human papillomavirus (HPV): Secondary | ICD-10-CM | POA: Diagnosis not present

## 2015-06-06 DIAGNOSIS — Z01419 Encounter for gynecological examination (general) (routine) without abnormal findings: Secondary | ICD-10-CM | POA: Diagnosis not present

## 2015-06-06 DIAGNOSIS — Z124 Encounter for screening for malignant neoplasm of cervix: Secondary | ICD-10-CM | POA: Diagnosis not present

## 2015-06-06 NOTE — Progress Notes (Signed)
Subjective:    Mallory Rich is a 35 y.o. MW G97 female who presents for an annual exam. The patient has no complaints today. The patient is sexually active. GYN screening history: last pap: was normal. The patient wears seatbelts: yes. The patient participates in regular exercise: yes. Has the patient ever been transfused or tattooed?: no. The patient reports that there is not domestic violence in her life.   Menstrual History: OB History    Gravida Para Term Preterm AB TAB SAB Ectopic Multiple Living   0 0 0 0 0 0 0 0 0 0       Menarche age: 73  No LMP recorded. Patient is not currently having periods (Reason: Oral contraceptives).    The following portions of the patient's history were reviewed and updated as appropriate: allergies, current medications, past family history, past medical history, past social history, past surgical history and problem list.  Review of Systems Pertinent items noted in HPI and remainder of comprehensive ROS otherwise negative.    Objective:    BP 120/72 mmHg  Pulse 76  Resp 16  Ht 5\' 9"  (1.753 m)  Wt 192 lb (87.091 kg)  BMI 28.34 kg/m2  General Appearance:    Alert, cooperative, no distress, appears stated age  Head:    Normocephalic, without obvious abnormality, atraumatic  Eyes:    PERRL, conjunctiva/corneas clear, EOM's intact, fundi    benign, both eyes  Ears:    Normal TM's and external ear canals, both ears  Nose:   Nares normal, septum midline, mucosa normal, no drainage    or sinus tenderness  Throat:   Lips, mucosa, and tongue normal; teeth and gums normal  Neck:   Supple, symmetrical, trachea midline, no adenopathy;    thyroid:  no enlargement/tenderness/nodules; no carotid   bruit or JVD  Back:     Symmetric, no curvature, ROM normal, no CVA tenderness  Lungs:     Clear to auscultation bilaterally, respirations unlabored  Chest Wall:    No tenderness or deformity   Heart:    Regular rate and rhythm, S1 and S2 normal, no murmur, rub    or gallop  Breast Exam:    No tenderness, masses, or nipple abnormality  Abdomen:     Soft, non-tender, bowel sounds active all four quadrants,    no masses, no organomegaly  Genitalia:    Normal female without lesion, discharge or tenderness, NSSmidplane, NT, mobile, normal adnexal exam     Extremities:   Extremities normal, atraumatic, no cyanosis or edema  Pulses:   2+ and symmetric all extremities  Skin:   Skin color, texture, turgor normal, no rashes or lesions  Lymph nodes:   Cervical, supraclavicular, and axillary nodes normal  Neurologic:   CNII-XII intact, normal strength, sensation and reflexes    throughout  .    Assessment:    Healthy female exam.    Plan:     Thin prep Pap smear. with cotesting Long discussion of conception/ Continue PNVs.

## 2015-06-08 LAB — CYTOLOGY - PAP

## 2015-07-29 IMAGING — CT CT MAXILLOFACIAL W/ CM
4 of 6 series · 11 of 30 positions shown, 12 images · IV contrast (omnipaque)
Comparison: None.

CLINICAL DATA: Pt seen [REDACTED] [REDACTED] for rt eye swelling, redness.
She noticed knot on rt side of face in front of ear. Knot marked
w/capsule. Rt side of face swelled. Has been on antibiotic but still
having rt eye swelling, redness and rt knot beside ear.

EXAM:
CT MAXILLOFACIAL WITH CONTRAST
TECHNIQUE: Multidetector CT imaging of the maxillofacial structures was
performed with intravenous contrast. Multiplanar CT image
reconstructions were also generated. A small metallic BB was placed
on the right temple in order to reliably differentiate right from
left.
CONTRAST:  75mL OMNIPAQUE IOHEXOL 300 MG/ML  SOLN

[Series 2: max soft · axial · 0.39mm/px · z∈[-148,-91]mm · 2 of 69 slices shown]
[im 23/69  brain]
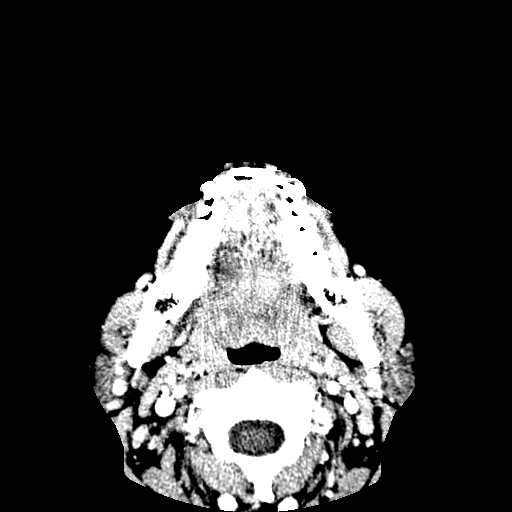
[im 46/69  brain]
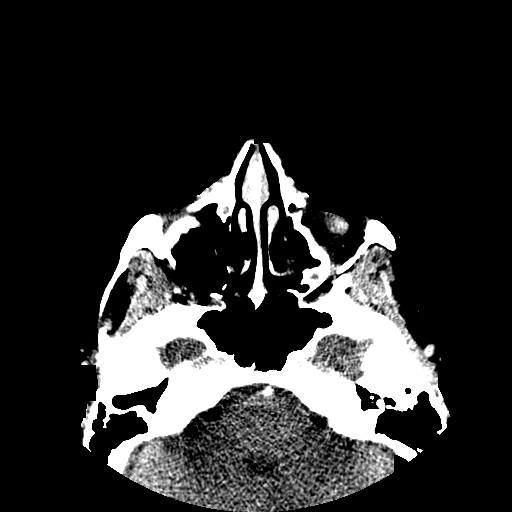

[Series 3: max bone · axial · 0.39mm/px · z∈[-148,-91]mm · 2 of 69 slices shown, 3 images]
[im 23/69  brain]
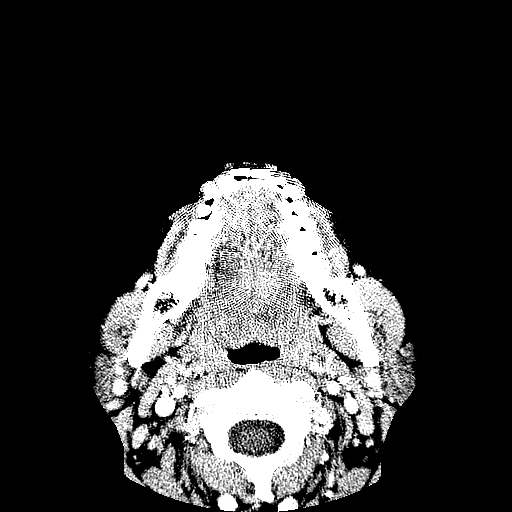
[im 23/69  bone]
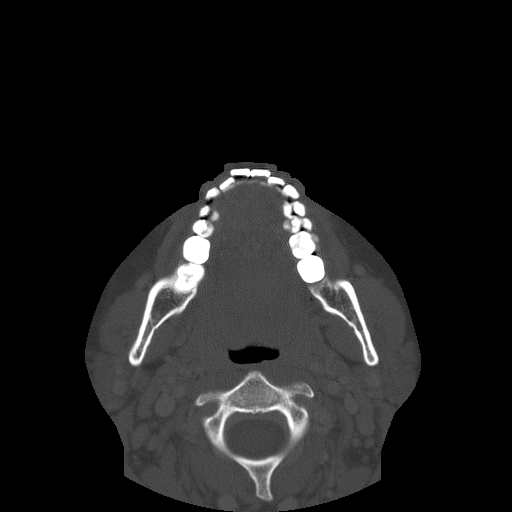
[im 46/69  bone]
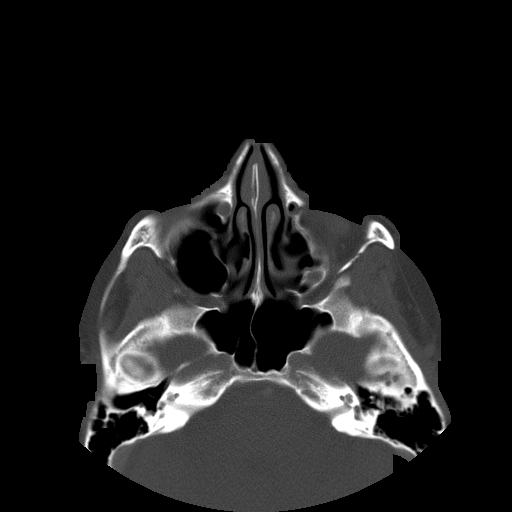

[Series 402: sag bone · sagittal · 0.39mm/px · 4 of 98 slices shown]
[im 20/98  bone]
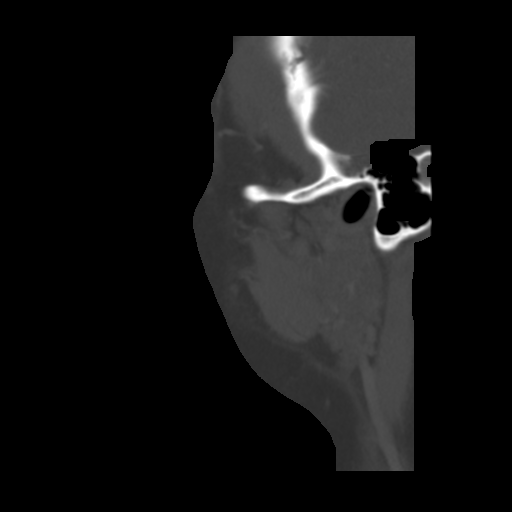
[im 39/98  bone]
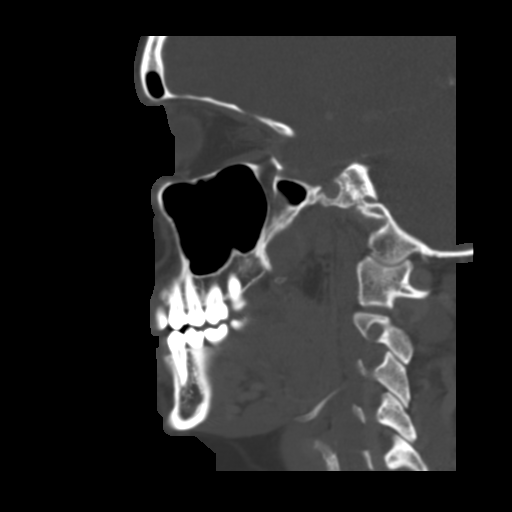
[im 59/98  bone]
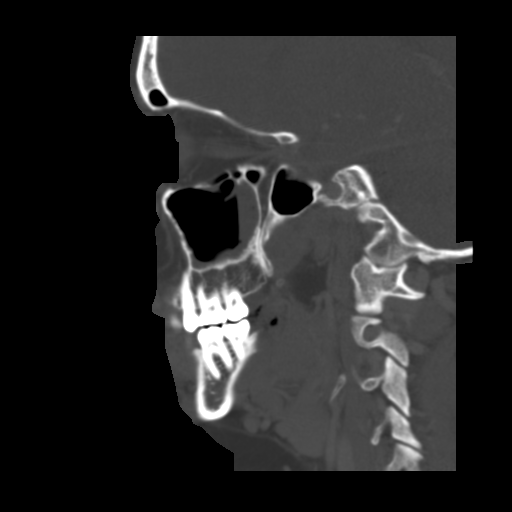
[im 78/98  bone]
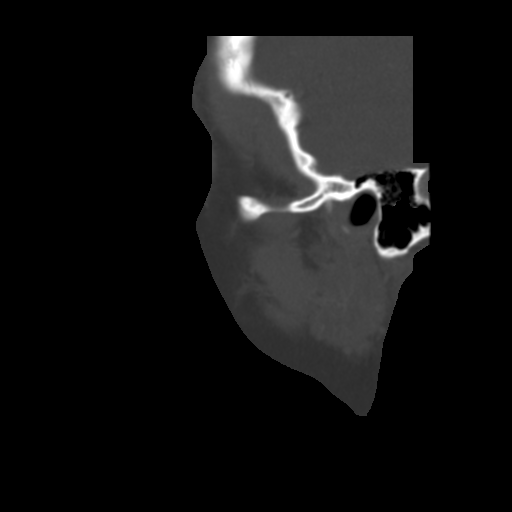

[Series 403: cor bone · coronal · 0.39mm/px · 3 of 83 slices shown]
[im 21/83  bone]
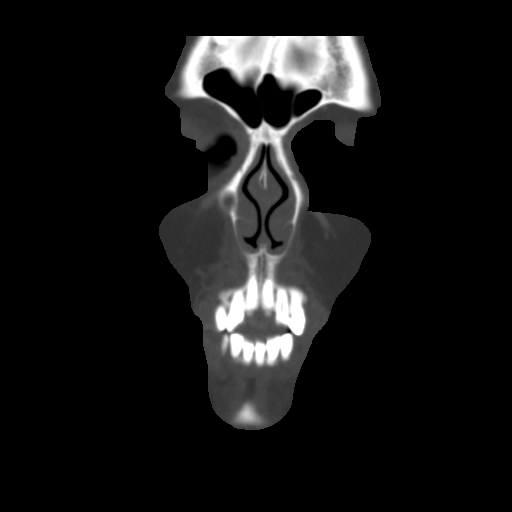
[im 42/83  bone]
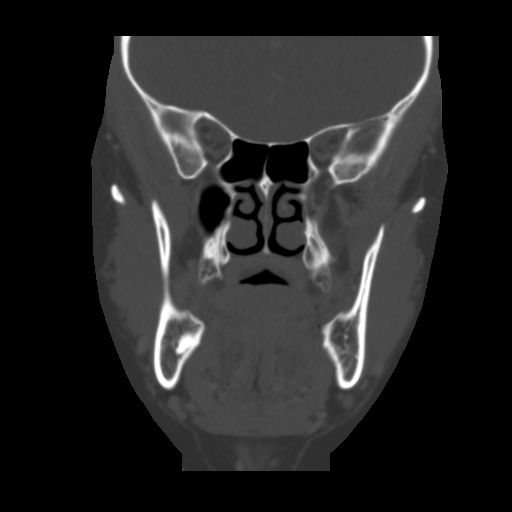
[im 62/83  bone]
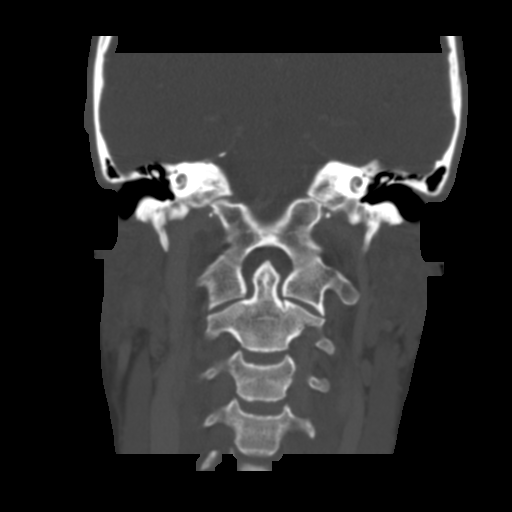

[11 of 30 positions shown; findings below may reference images not displayed]

FINDINGS: Dependent fluid lies in the left maxillary sinus. Remaining sinuses
are clear. Clear mastoid air cells and middle ear cavities.

Normal globes and orbits. Structures of the skullbase are
unremarkable.

14 mm x 11 mm x 16 mm oval circumscribed mass lies along the
anterior margin of the right parotid gland most likely a mildly
enlarged lymph node. No other evidence of adenopathy.

Major salivary glands are unremarkable. No mucosal mass or
significant asymmetry. Airway is widely patent.
IMPRESSION: 1. The palpable abnormality is a 16 mm x 11 mm x 14 mm circumscribed
oval mass that lies along the anterior margin of the right parotid
gland superficial to the posterior margin of the right masseter
muscle. This is most likely a mildly enlarged lymph node. It is
nonspecific, however.

2.  No other masses or evidence of adenopathy.

3. Air-fluid level in the left maxillary sinus. This could reflect
acute sinusitis if there are consistent clinical symptoms. Remaining
sinuses are clear.

## 2015-08-17 ENCOUNTER — Other Ambulatory Visit: Payer: Self-pay | Admitting: *Deleted

## 2015-08-17 DIAGNOSIS — Z3041 Encounter for surveillance of contraceptive pills: Secondary | ICD-10-CM

## 2015-08-17 MED ORDER — LEVONORGEST-ETH ESTRAD 91-DAY 0.15-0.03 MG PO TABS: 1.0000 | ORAL_TABLET | Freq: Every day | ORAL | Status: DC

## 2015-08-17 NOTE — Telephone Encounter (Signed)
Fax authorization for RF on Mallory Rich authorized as pt had annual in 2/17 with Dr Hulan Fray.

## 2015-09-10 ENCOUNTER — Other Ambulatory Visit: Payer: Self-pay | Admitting: Physician Assistant

## 2015-09-14 ENCOUNTER — Other Ambulatory Visit: Payer: Self-pay | Admitting: *Deleted

## 2015-10-01 IMAGING — US US SOFT TISSUE HEAD/NECK
1 series · 14 of 17 positions shown · non-contrast
Comparison: 05/11/2014

CLINICAL DATA: Follow-up enlarged lymph node.

EXAM:
ULTRASOUND OF HEAD/NECK SOFT TISSUES
TECHNIQUE: Ultrasound examination of the head and neck soft tissues was
performed in the area of clinical concern.

[Series 1: us soft tissue head/neck · 0.05mm/px · 14 of 17 slices shown]
[im 1/17]
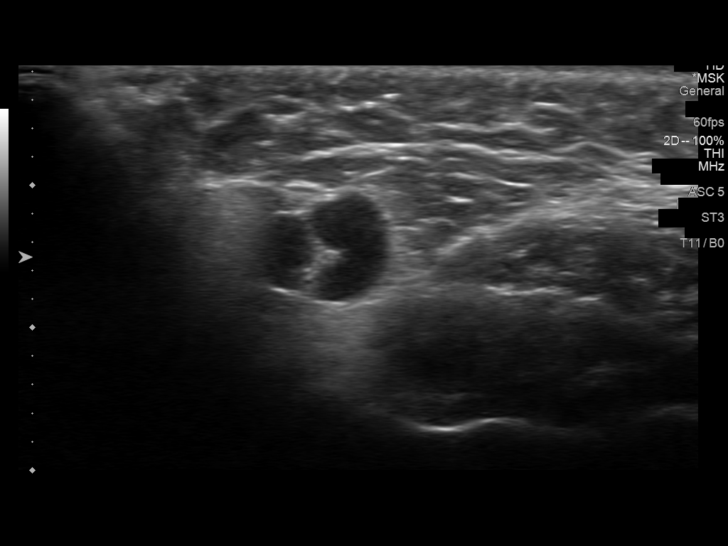
[im 2/17]
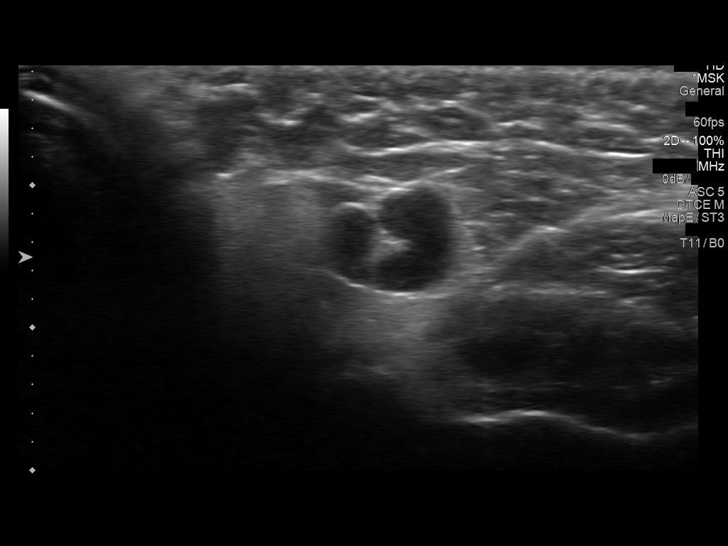
[im 4/17]
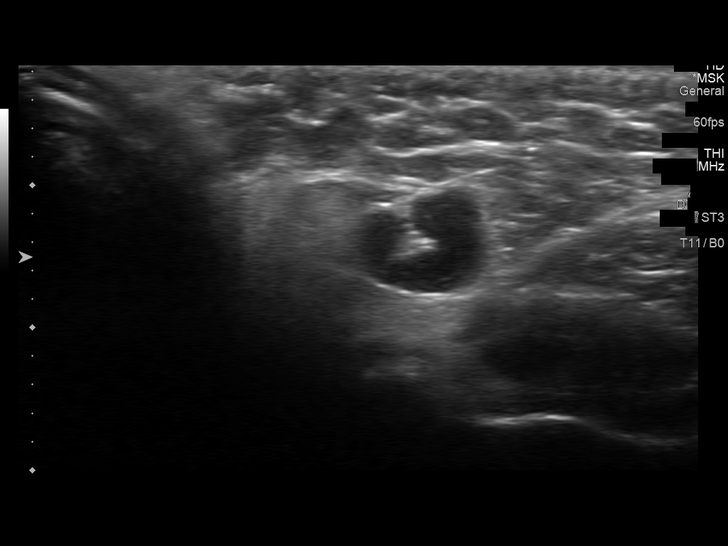
[im 5/17]
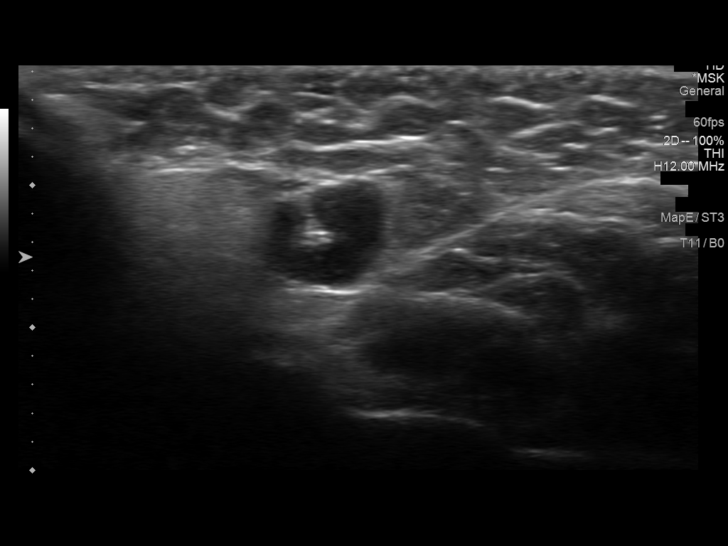
[im 6/17]
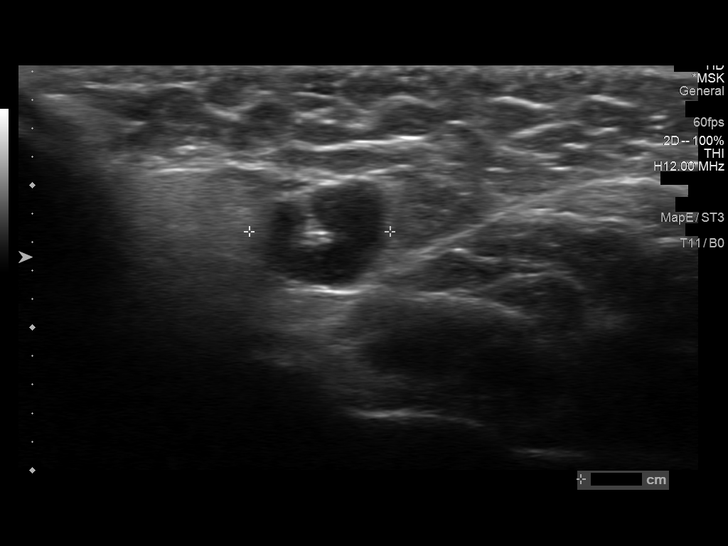
[im 7/17]
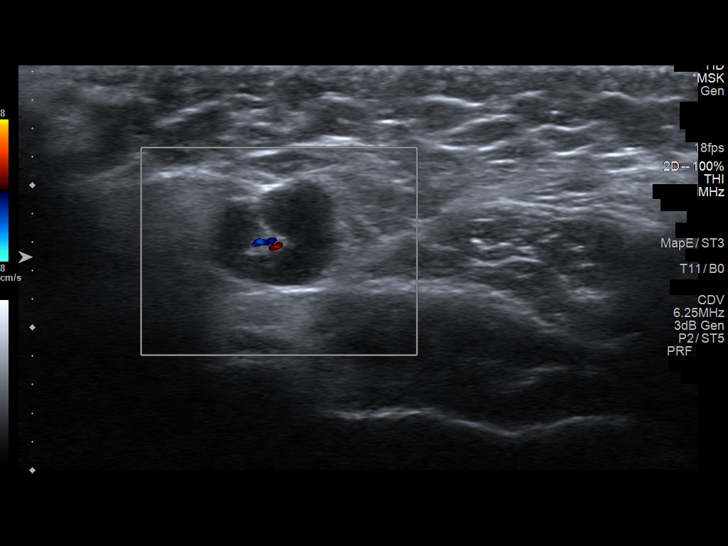
[im 8/17]
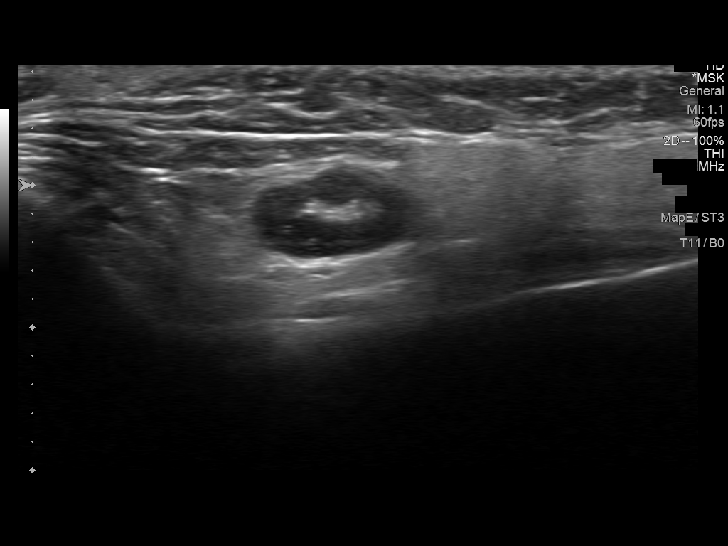
[im 10/17]
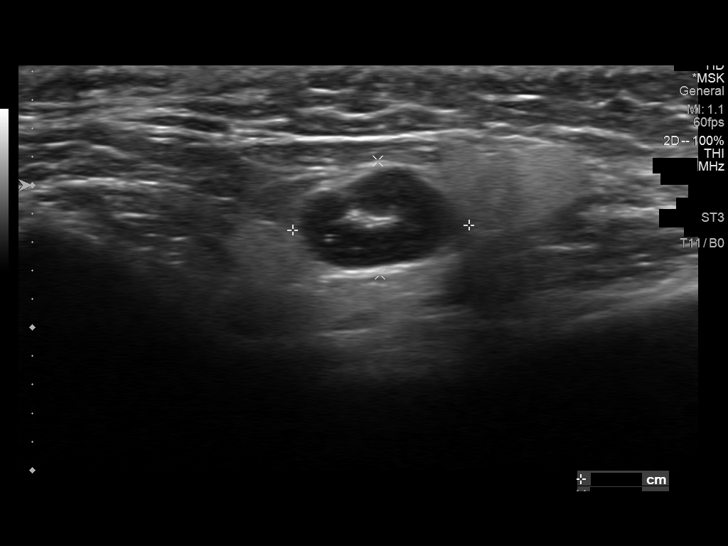
[im 11/17]
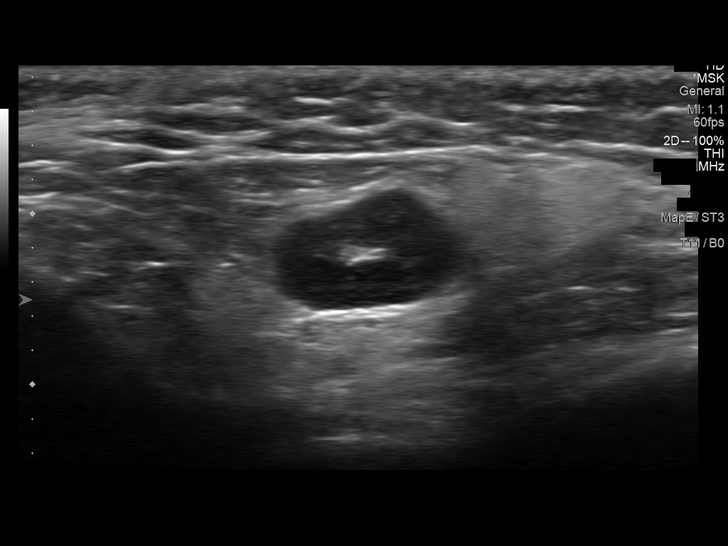
[im 12/17]
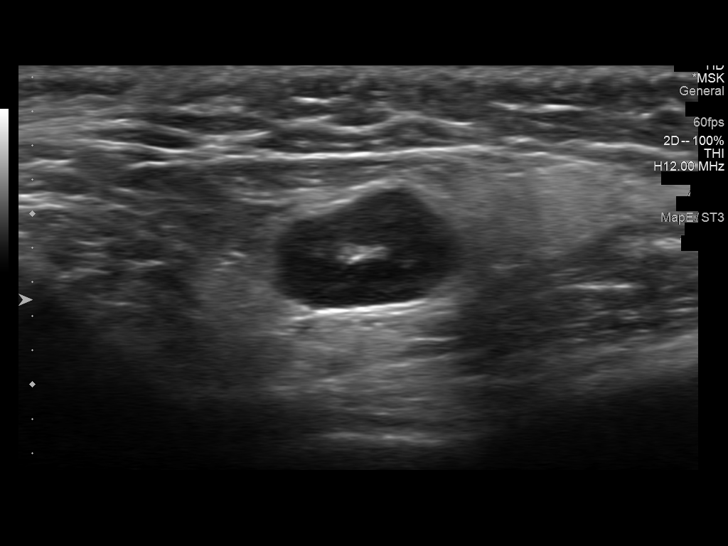
[im 13/17]
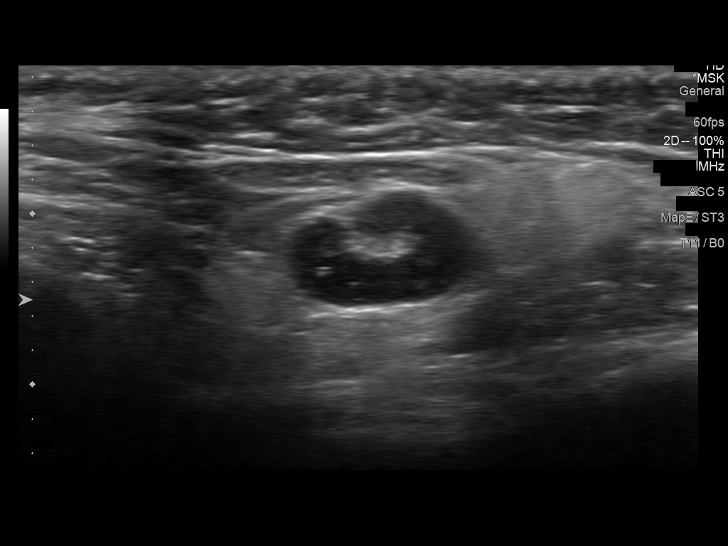
[im 14/17]
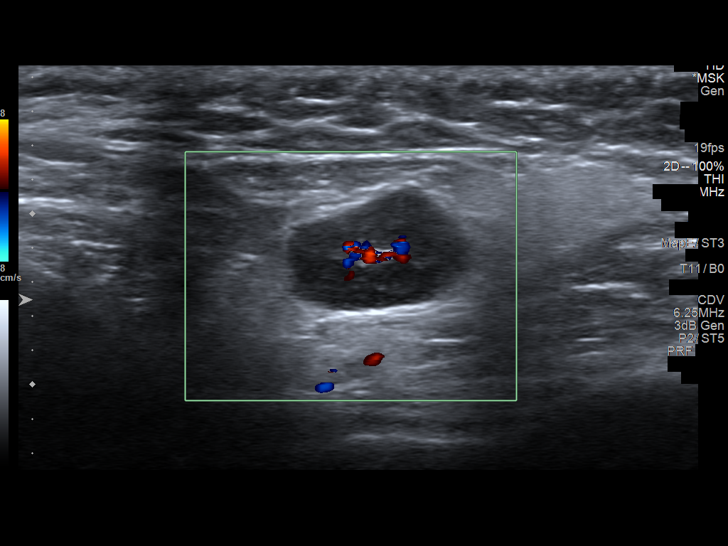
[im 16/17]
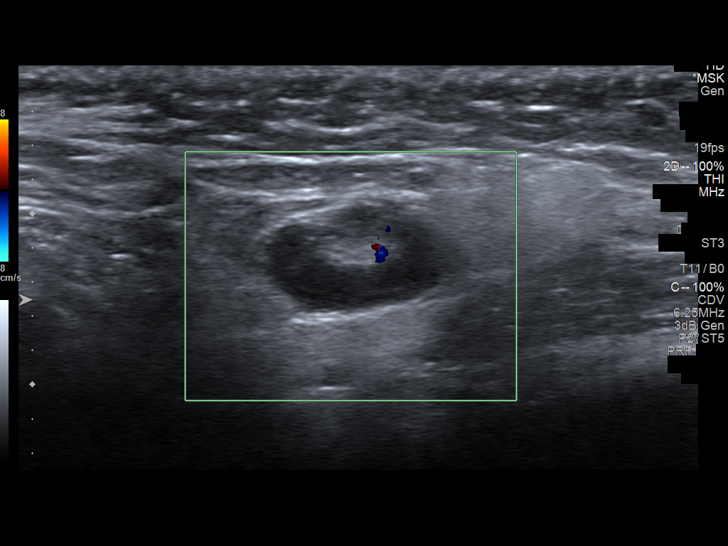
[im 17/17]
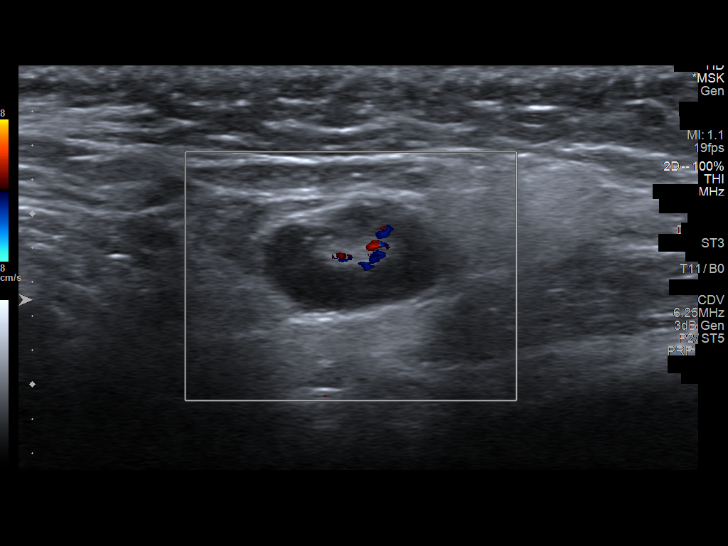

[14 of 17 positions shown; findings below may reference images not displayed]

FINDINGS: The area of concern is anterior to the right ear. Again noted is a
hypoechoic node with a fatty hilum. This node measures 1.2 x 0.8 x
1.0 cm, previously measured 1.6 x 1.0 x 1.2 cm. The lymph node
morphology has not significantly changed. No other enlarged lymph
nodes in the surrounding soft tissues.
IMPRESSION: The lymph node anterior to the right ear has slightly decreased in
size since 05/11/2014.

## 2015-10-20 ENCOUNTER — Ambulatory Visit (INDEPENDENT_AMBULATORY_CARE_PROVIDER_SITE_OTHER): Payer: BC Managed Care – PPO | Admitting: Sports Medicine

## 2015-10-20 VITALS — BP 132/89 | HR 100 | Temp 98.1°F | Wt 193.0 lb

## 2015-10-20 DIAGNOSIS — N3091 Cystitis, unspecified with hematuria: Secondary | ICD-10-CM | POA: Insufficient documentation

## 2015-10-20 LAB — POCT URINALYSIS DIPSTICK
Bilirubin, UA: NEGATIVE
Glucose, UA: NEGATIVE
Ketones, UA: NEGATIVE
Leukocytes, UA: NEGATIVE
Nitrite, UA: NEGATIVE
Protein, UA: NEGATIVE
Spec Grav, UA: 1.015
Urobilinogen, UA: 0.2
pH, UA: 6

## 2015-10-20 MED ORDER — CEPHALEXIN 500 MG PO CAPS: 500.0000 mg | ORAL_CAPSULE | Freq: Two times a day (BID) | ORAL | Status: DC

## 2015-10-20 NOTE — Progress Notes (Signed)
   Subjective:    Patient ID: Mallory Rich, female    DOB: 10/14/1980, 35 y.o.   MRN: [id number]  HPI Patient is here today with a chief complaint of frequent urination and hematuria. No pelvic pain, burning with urination, or back pain noted.    Review of Systems     Objective:   Physical Exam        Assessment & Plan:  POCT urinalysis was positive for moderate blood. Results reviewed by Dr. Dianah Field, abx sent to pharmacy on file and urine culture ordered. Pt advised that we will call with results of the culture. Pt verbalized understanding.

## 2015-10-20 NOTE — Assessment & Plan Note (Signed)
Adding Keflex, urine culture. She will need a repeat urinalysis in about a month to ensure clearance of hematuria.

## 2015-10-24 LAB — URINE CULTURE: Colony Count: 50000

## 2015-10-24 MED ORDER — NITROFURANTOIN MONOHYD MACRO 100 MG PO CAPS: 100.0000 mg | ORAL_CAPSULE | Freq: Two times a day (BID) | ORAL | Status: DC

## 2015-10-24 NOTE — Addendum Note (Signed)
Addended by: Silverio Decamp on: 10/24/2015 11:00 AM   Modules accepted: Orders

## 2016-01-04 ENCOUNTER — Encounter: Payer: Self-pay | Admitting: Physician Assistant

## 2016-01-04 ENCOUNTER — Ambulatory Visit (INDEPENDENT_AMBULATORY_CARE_PROVIDER_SITE_OTHER): Payer: BC Managed Care – PPO | Admitting: Physician Assistant

## 2016-01-04 VITALS — BP 115/92 | HR 90 | Temp 98.0°F | Ht 69.0 in | Wt 184.0 lb

## 2016-01-04 DIAGNOSIS — R05 Cough: Secondary | ICD-10-CM

## 2016-01-04 DIAGNOSIS — R220 Localized swelling, mass and lump, head: Secondary | ICD-10-CM | POA: Diagnosis not present

## 2016-01-04 DIAGNOSIS — H6983 Other specified disorders of Eustachian tube, bilateral: Secondary | ICD-10-CM | POA: Diagnosis not present

## 2016-01-04 DIAGNOSIS — J302 Other seasonal allergic rhinitis: Secondary | ICD-10-CM | POA: Diagnosis not present

## 2016-01-04 DIAGNOSIS — R059 Cough, unspecified: Secondary | ICD-10-CM

## 2016-01-04 MED ORDER — METHYLPREDNISOLONE SODIUM SUCC 125 MG IJ SOLR
125.0000 mg | Freq: Once | INTRAMUSCULAR | Status: AC
  Administered 2016-01-04: 125 mg via INTRAMUSCULAR

## 2016-01-04 NOTE — Progress Notes (Addendum)
Subjective:     Patient ID: Mallory Rich, female   DOB: [birth year], 35 y.o.   MRN: [id number]  HPI  Patient is a 35 y.o. Caucasian female presenting today with complaints of sinus pressure and ear pain. The patient states that she has bad allergies but that she began having worsening of her symptoms yesterday night. Patient reports that she is having right sided facial swelling, sinus pressure, headache, and ear congestion. She states that she has also developed a cough since yesterday and post-nasal drip. She notes that she has been taking zyrtec (wal-finate) over-the-counter with some symptom relief. Patient denies fever, shortness of breath, sore throat, chest pain, or palpitations.  Review of Systems  Constitutional: Negative for activity change, appetite change, chills, diaphoresis, fatigue, fever and unexpected weight change.  HENT: Positive for congestion, ear pain, postnasal drip, sinus pressure and sore throat. Negative for ear discharge and rhinorrhea.   Eyes: Positive for redness and itching. Negative for pain and discharge.  Respiratory: Positive for cough and wheezing. Negative for chest tightness and shortness of breath.   Cardiovascular: Negative for chest pain, palpitations and leg swelling.  Gastrointestinal: Negative.   Genitourinary: Negative.   Musculoskeletal: Negative.   Skin: Negative.   Neurological: Positive for headaches. Negative for dizziness, weakness, light-headedness and numbness.      Objective:   Physical Exam  Constitutional: She is oriented to person, place, and time. She appears well-developed and well-nourished. No distress.  HENT:  Head: Normocephalic and atraumatic.    Right Ear: External ear normal.  Left Ear: External ear normal.  Nose: Nose normal.  Mouth/Throat: Oropharynx is clear and moist. No oropharyngeal exudate.  Eyes: Conjunctivae and EOM are normal. Pupils are equal, round, and reactive to light. Right eye exhibits no discharge. Left  eye exhibits no discharge. No scleral icterus.  Neck: Normal range of motion. Neck supple. No JVD present. No tracheal deviation present. No thyromegaly present.  Cardiovascular: Normal rate, regular rhythm, normal heart sounds and intact distal pulses.  Exam reveals no gallop and no friction rub.   No murmur heard. Pulmonary/Chest: Effort normal and breath sounds normal. No stridor. No respiratory distress. She has no wheezes. She has no rales. She exhibits no tenderness.  Lymphadenopathy:    She has cervical adenopathy.  Neurological: She is alert and oriented to person, place, and time. No cranial nerve deficit. Coordination normal.  Skin: Skin is warm and dry. No rash noted. She is not diaphoretic. No erythema. No pallor.  Psychiatric: She has a normal mood and affect. Her behavior is normal. Judgment and thought content normal.      Assessment:     Mallory Rich was seen today for headache and ear pressure.  Diagnoses and all orders for this visit:  Other seasonal allergic rhinitis -     methylPREDNISolone sodium succinate (SOLU-MEDROL) 125 mg/2 mL injection 125 mg; Inject 2 mLs (125 mg total) into the muscle once.  Cough -     methylPREDNISolone sodium succinate (SOLU-MEDROL) 125 mg/2 mL injection 125 mg; Inject 2 mLs (125 mg total) into the muscle once.      Plan:     1. Allergic rhinitis/cough - Discussed with patient that her symptoms do not appear infectious at this time. I suspect etiology is allergic in nature. Patient was given solu-medrol injection in clinic today. Patient instructed to do symptomatic treatment with over-the-counter tylenol cold sinus severe, Flonase 2 sprays in each nostril, and anti-histamines. Patient reports increase heart rate with Sudafed so  she was instructed to stay away from pseudoephedrine.Patient is aware that if her symptoms do not improve in 5-7 days we will discuss possible antibiotic treatment.   Summary - Patient to return-to-clinic if symptoms  do not improve or worsen.

## 2016-01-04 NOTE — Patient Instructions (Signed)
Tylenol cold sinus severe flonase 2 sprays each nostril.  Stay away from Pseudoephedrine.

## 2016-02-20 ENCOUNTER — Ambulatory Visit (INDEPENDENT_AMBULATORY_CARE_PROVIDER_SITE_OTHER): Payer: BC Managed Care – PPO | Admitting: Family Medicine

## 2016-02-20 ENCOUNTER — Encounter: Payer: Self-pay | Admitting: Family Medicine

## 2016-02-20 DIAGNOSIS — M779 Enthesopathy, unspecified: Principal | ICD-10-CM

## 2016-02-20 DIAGNOSIS — M7750 Other enthesopathy of unspecified foot: Secondary | ICD-10-CM

## 2016-02-20 DIAGNOSIS — M778 Other enthesopathies, not elsewhere classified: Secondary | ICD-10-CM

## 2016-02-20 MED ORDER — DICLOFENAC SODIUM 1 % TD GEL: 2.0000 g | Freq: Four times a day (QID) | TRANSDERMAL | 11 refills | Status: DC

## 2016-02-20 NOTE — Progress Notes (Signed)
   Subjective:    I'm seeing this patient as a consultation for:  Iran Planas, PA-C   CC: Right plantar foot pain.   HPI: Patient notes about a week of right plantar and medial foot pain. She notes the pain started suddenly while walking and felt as though there is a tearing sensation.  Pain is persistent and worse with walking longer than 5 minutes. She denies any radiating pain weakness or numbness. The pain is not consistent with previous episodes of plantar fasciitis. She's tried some over-the-counter treatments which have not helped.  Past medical history, Surgical history, Family history not pertinant except as noted below, Social history, Allergies, and medications have been entered into the medical record, reviewed, and no changes needed.   Review of Systems: No headache, visual changes, nausea, vomiting, diarrhea, constipation, dizziness, abdominal pain, skin rash, fevers, chills, night sweats, weight loss, swollen lymph nodes, body aches, joint swelling, muscle aches, chest pain, shortness of breath, mood changes, visual or auditory hallucinations.   Objective:    Vitals:   02/20/16 1004  BP: 117/73  Pulse: 75   General: Well Developed, well nourished, and in no acute distress.  Neuro/Psych: Alert and oriented x3, extra-ocular muscles intact, able to move all 4 extremities, sensation grossly intact. Skin: Warm and dry, no rashes noted.  Respiratory: Not using accessory muscles, speaking in full sentences, trachea midline.  Cardiovascular: Pulses palpable, no extremity edema. Abdomen: Does not appear distended. MSK: Right foot is normal-appearing. Patient is tender along the flexor  hallux longus tendon in the midfoot and into the tarsal tunnel and medial ankle. Ankle motion is intact. Patient has no pain with resisted ankle dorsiflexion plantar flexion inversion and eversion. Patient has some pain with resisted great toe plantar flexion Normal gait.  No results found  for this or any previous visit (from the past 24 hour(s)). No results found.  Impression and Recommendations:    Assessment and Plan: 35 y.o. female with Irritation or tendinitis of the flexor hallux longus tendon of the right foot. Plan for postoperative shoe diclofenac gel and home eccentric physical therapy. Recheck in 3-4 weeks.   Discussed warning signs or symptoms. Please see discharge instructions. Patient expresses understanding.

## 2016-02-20 NOTE — Patient Instructions (Signed)
Thank you for coming in today. Use the voltaren gel as needed.  Apply ice as needed.  Use the shoe as needed.  Return in 3-4 weeks for recheck.

## 2016-02-21 ENCOUNTER — Telehealth: Payer: Self-pay | Admitting: *Deleted

## 2016-02-21 NOTE — Telephone Encounter (Signed)
Called patient to let her know that insurance will not cover diclofenac and to go to [email or web address] to print coupon.

## 2016-03-13 ENCOUNTER — Ambulatory Visit: Payer: BC Managed Care – PPO | Admitting: Family Medicine

## 2016-05-16 ENCOUNTER — Ambulatory Visit (INDEPENDENT_AMBULATORY_CARE_PROVIDER_SITE_OTHER): Payer: BC Managed Care – PPO | Admitting: Osteopathic Medicine

## 2016-05-16 ENCOUNTER — Encounter: Payer: Self-pay | Admitting: Osteopathic Medicine

## 2016-05-16 VITALS — BP 121/76 | HR 72 | Temp 98.0°F | Ht 69.0 in | Wt 191.0 lb

## 2016-05-16 DIAGNOSIS — B9689 Other specified bacterial agents as the cause of diseases classified elsewhere: Secondary | ICD-10-CM

## 2016-05-16 DIAGNOSIS — J329 Chronic sinusitis, unspecified: Secondary | ICD-10-CM | POA: Diagnosis not present

## 2016-05-16 MED ORDER — AMOXICILLIN-POT CLAVULANATE 875-125 MG PO TABS: 1.0000 | ORAL_TABLET | Freq: Two times a day (BID) | ORAL | 0 refills | Status: DC

## 2016-05-16 NOTE — Progress Notes (Signed)
HPI: Mallory Rich is a 36 y.o. female who presents to [facility] 05/16/16 for chief complaint of:  Chief Complaint  Patient presents with  . Sinus Problem    Acute Illness: . Context: previous episodes was given steroid shot . Location/Quality: R side face, pressure behind/under eye and ear pressure . Assoc signs/symptoms: see ROS . Duration: few days, last week had cold symptoms . Modifying factors: has tried the following OTC/Rx medications: none except Mucinex   Past medical, social and family history reviewed. Current medications and allergies reviewed.     Review of Systems:  Constitutional: No  fever/chills  HEENT: Yes  headache, No  sore throat, No  swollen glands  Cardiovascular: No chest pain  Respiratory:No  cough, No  shortness of breath  Gastrointestinal: No  nausea, No  vomiting,  No  diarrhea  Musculoskeletal:   No  myalgia/arthralgia  Skin/Integument:  No  rash   Exam:  BP 121/76   Pulse 72   Temp 98 F (36.7 C) (Oral)   Ht 5\' 9"  (1.753 m)   Wt 191 lb (86.6 kg)   BMI 28.21 kg/m   Constitutional: VSS, see above. General Appearance: alert, well-developed, well-nourished, NAD  Eyes: Normal lids and conjunctive, non-icteric sclera, PERRLA  Ears, Nose, Mouth, Throat: Normal external inspection ears/nares/mouth/lips/gums, normal TM, MMM; posterior pharynx without erythema, without exudate, nasal mucosa normal  Neck: No masses, trachea midline. normal lymph nodes  Respiratory: Normal respiratory effort. No  wheeze/rhonchi/rales  Cardiovascular: S1/S2 normal, no murmur/rub/gallop auscultated. RRR.    ASSESSMENT/PLAN: second sickening and no severe lymphadenopathy/swelling/teenderness to indicate steroid therapy - trial abx. Reviewed CT sinus results from 2016, no nasal polyp. OTC meds list given  Bacterial sinusitis - Plan: amoxicillin-clavulanate (AUGMENTIN) 875-125 MG tablet     Visit summary was  printed for the patient with medications and pertinent instructions for patient to review. ER/RTC precautions reviewed. All questions answered. Return if symptoms worsen or fail to improve.

## 2016-05-16 NOTE — Patient Instructions (Signed)
Note: the following list assumes no pregnancy, normal liver & kidney function and no other drug interactions. Dr. Sheppard Coil has highlighted medications which are safe for you to use, but these may not be appropriate for everyone. Always ask a pharmacist or qualified medical provider if there are any questions!    Aches/Pains, Fever Acetaminophen (Tylenol) 500 mg tablets - take max 2 tablets (1000 mg) every 6 hours (4 times per day)  Ibuprofen (Motrin) 200 mg tablets - take max 4 tablets (800 mg) every 6 hours  Sinus Congestion Prescription Atrovent Cromolyn Nasal Spray (NasalCrom) 1 spray each nostril 3-4 times per day, max 6 imes per day Nasal Saline if desired Oxymetolazone (Afrin, others) sparing use due to rebound congestion Phenylephrine (Sudafed) 10 mg tablets every 4 hours (or the 12-hour formulation) Diphenhydramine (Benadryl) 25 mg tablets - take max 2 tablets every 4 hours  Cough & Sore Throat Prescription cough pills or syrups Dextromethorphan (Robitussin, others) - cough suppressant Guaifenesin (Robitussin, Mucinex, others) - expectorant (helps cough up mucus) (Dextromethorphan and Guaifenesin also come in a combination tablet) Lozenges w/ Benzocaine + Menthol (Cepacol) Honey - as much as you want! Teas which "coat the throat" - look for ingredients Elm Bark, Licorice Root, Marshmallow Root  Other Zinc Lozenges within 24 hours of symptoms onset - mixed evidence this shortens the duration of the common cold Don't waste your money on Vitamin C or Echinacea         Sinusitis, Adult Sinusitis is soreness and inflammation of your sinuses. Sinuses are hollow spaces in the bones around your face. Your sinuses are located:  Around your eyes.  In the middle of your forehead.  Behind your nose.  In your cheekbones. Your sinuses and nasal passages are lined with a stringy fluid (mucus). Mucus normally drains out of your sinuses. When your nasal tissues become inflamed or  swollen, the mucus can become trapped or blocked so air cannot flow through your sinuses. This allows bacteria, viruses, and funguses to grow, which leads to infection. Sinusitis can develop quickly and last for 7?10 days (acute) or for more than 12 weeks (chronic). Sinusitis often develops after a cold. What are the causes? This condition is caused by anything that creates swelling in the sinuses or stops mucus from draining, including:  Allergies.  Asthma.  Bacterial or viral infection.  Abnormally shaped bones between the nasal passages.  Nasal growths that contain mucus (nasal polyps).  Narrow sinus openings.  Pollutants, such as chemicals or irritants in the air.  A foreign object stuck in the nose.  A fungal infection. This is rare. What increases the risk? The following factors may make you more likely to develop this condition:  Having allergies or asthma.  Having had a recent cold or respiratory tract infection.  Having structural deformities or blockages in your nose or sinuses.  Having a weak immune system.  Doing a lot of swimming or diving.  Overusing nasal sprays.  Smoking. What are the signs or symptoms? The main symptoms of this condition are pain and a feeling of pressure around the affected sinuses. Other symptoms include:  Upper toothache.  Earache.  Headache.  Bad breath.  Decreased sense of smell and taste.  A cough that may get worse at night.  Fatigue.  Fever.  Thick drainage from your nose. The drainage is often green and it may contain pus (purulent).  Stuffy nose or congestion.  Postnasal drip. This is when extra mucus collects in the throat or  back of the nose.  Swelling and warmth over the affected sinuses.  Sore throat.  Sensitivity to light. How is this diagnosed? This condition is diagnosed based on symptoms, a medical history, and a physical exam. To find out if your condition is acute or chronic, your health care  provider may:  Look in your nose for signs of nasal polyps.  Tap over the affected sinus to check for signs of infection.  View the inside of your sinuses using an imaging device that has a light attached (endoscope). If your health care provider suspects that you have chronic sinusitis, you may also:  Be tested for allergies.  Have a sample of mucus taken from your nose (nasal culture) and checked for bacteria.  Have a mucus sample examined to see if your sinusitis is related to an allergy. If your sinusitis does not respond to treatment and it lasts longer than 8 weeks, you may have an MRI or CT scan to check your sinuses. These scans also help to determine how severe your infection is. In rare cases, a bone biopsy may be done to rule out more serious types of fungal sinus disease. How is this treated? Treatment for sinusitis depends on the cause and whether your condition is chronic or acute. If a virus is causing your sinusitis, your symptoms will go away on their own within 10 days. You may be given medicines to relieve your symptoms, including:  Topical nasal decongestants. They shrink swollen nasal passages and let mucus drain from your sinuses.  Antihistamines. These drugs block inflammation that is triggered by allergies. This can help to ease swelling in your nose and sinuses.  Topical nasal corticosteroids. These are nasal sprays that ease inflammation and swelling in your nose and sinuses.  Nasal saline washes. These rinses can help to get rid of thick mucus in your nose. If your condition is caused by bacteria, you will be given an antibiotic medicine. If your condition is caused by a fungus, you will be given an antifungal medicine. Surgery may be needed to correct underlying conditions, such as narrow nasal passages. Surgery may also be needed to remove polyps. Follow these instructions at home: Medicines  Take, use, or apply over-the-counter and prescription medicines  only as told by your health care provider. These may include nasal sprays.  If you were prescribed an antibiotic medicine, take it as told by your health care provider. Do not stop taking the antibiotic even if you start to feel better. Hydrate and Humidify  Drink enough water to keep your urine clear or pale yellow. Staying hydrated will help to thin your mucus.  Use a cool mist humidifier to keep the humidity level in your home above 50%.  Inhale steam for 10-15 minutes, 3-4 times a day or as told by your health care provider. You can do this in the bathroom while a hot shower is running.  Limit your exposure to cool or dry air. Rest  Rest as much as possible.  Sleep with your head raised (elevated).  Make sure to get enough sleep each night. General instructions  Apply a warm, moist washcloth to your face 3-4 times a day or as told by your health care provider. This will help with discomfort.  Wash your hands often with soap and water to reduce your exposure to viruses and other germs. If soap and water are not available, use hand sanitizer.  Do not smoke. Avoid being around people who are smoking (secondhand smoke).  Keep all follow-up visits as told by your health care provider. This is important. Contact a health care provider if:  You have a fever.  Your symptoms get worse.  Your symptoms do not improve within 10 days. Get help right away if:  You have a severe headache.  You have persistent vomiting.  You have pain or swelling around your face or eyes.  You have vision problems.  You develop confusion.  Your neck is stiff.  You have trouble breathing. This information is not intended to replace advice given to you by your health care provider. Make sure you discuss any questions you have with your health care provider. Document Released: 04/02/2005 Document Revised: 11/27/2015 Document Reviewed: 01/26/2015 Elsevier Interactive Patient Education  2017  Elsevier

## 2016-05-17 ENCOUNTER — Other Ambulatory Visit: Payer: Self-pay | Admitting: Physician Assistant

## 2016-05-25 ENCOUNTER — Telehealth: Payer: Self-pay | Admitting: Physician Assistant

## 2016-05-25 NOTE — Telephone Encounter (Signed)
Called and left message to see if patient has had or would like to receive a flu vaccine. - CF

## 2016-06-28 ENCOUNTER — Telehealth: Payer: Self-pay | Admitting: Physician Assistant

## 2016-06-28 DIAGNOSIS — D229 Melanocytic nevi, unspecified: Secondary | ICD-10-CM

## 2016-06-28 NOTE — Telephone Encounter (Signed)
Pt would like a referral to Dermatology for a mole on her back. Thanks

## 2016-06-29 NOTE — Telephone Encounter (Signed)
Ok for derm referral 

## 2016-06-29 NOTE — Telephone Encounter (Signed)
Referral placed.

## 2016-07-18 ENCOUNTER — Encounter: Payer: Self-pay | Admitting: Obstetrics & Gynecology

## 2016-07-18 ENCOUNTER — Ambulatory Visit (INDEPENDENT_AMBULATORY_CARE_PROVIDER_SITE_OTHER): Payer: BC Managed Care – PPO | Admitting: Obstetrics & Gynecology

## 2016-07-18 VITALS — BP 114/64 | HR 85 | Wt 186.0 lb

## 2016-07-18 DIAGNOSIS — Z113 Encounter for screening for infections with a predominantly sexual mode of transmission: Secondary | ICD-10-CM

## 2016-07-18 DIAGNOSIS — Z3401 Encounter for supervision of normal first pregnancy, first trimester: Secondary | ICD-10-CM

## 2016-07-18 DIAGNOSIS — Z3689 Encounter for other specified antenatal screening: Secondary | ICD-10-CM | POA: Diagnosis not present

## 2016-07-18 DIAGNOSIS — E639 Nutritional deficiency, unspecified: Secondary | ICD-10-CM

## 2016-07-18 DIAGNOSIS — N86 Erosion and ectropion of cervix uteri: Secondary | ICD-10-CM | POA: Insufficient documentation

## 2016-07-18 DIAGNOSIS — O09511 Supervision of elderly primigravida, first trimester: Secondary | ICD-10-CM | POA: Diagnosis not present

## 2016-07-18 DIAGNOSIS — Z3490 Encounter for supervision of normal pregnancy, unspecified, unspecified trimester: Secondary | ICD-10-CM

## 2016-07-18 NOTE — Progress Notes (Signed)
Bedside U/S shows IUP with FHT of 173 BPM and CRL is 28.29mm  GA [redacted]w[redacted]d

## 2016-07-18 NOTE — Progress Notes (Signed)
  Subjective:    ROSAISELA JAMROZ is a G1P0000 [redacted]w[redacted]d being seen today for her first obstetrical visit.  Her obstetrical history is significant for advanced maternal age and overwight (BMI 27). Patient does intend to breast feed. Pregnancy history fully reviewed.  Patient reports fatigue.  Vitals:   07/18/16 1016  BP: 114/64  Pulse: 85  Weight: 186 lb (84.4 kg)    HISTORY: OB History  Gravida Para Term Preterm AB Living  1 0 0 0 0 0  SAB TAB Ectopic Multiple Live Births  0 0 0 0      # Outcome Date GA Lbr Len/2nd Weight Sex Delivery Anes PTL Lv  1 Current              Past Medical History:  Diagnosis Date  . Allergy   . CARDIAC MURMUR 08/10/2009   Qualifier: Diagnosis of  By: Stanford Breed, MD, Kandyce Rud   . CHEST PAIN 08/10/2009   Qualifier: Diagnosis of  By: Stanford Breed, MD, Kandyce Rud    Past Surgical History:  Procedure Laterality Date  . wisdom teeth removal     Family History  Problem Relation Age of Onset  . Stroke Paternal Grandmother   . Melanoma Maternal Grandfather   . Stroke Maternal Grandmother      Exam    Uterus:     Pelvic Exam:    Perineum: No Hemorrhoids   Vulva: normal   Vagina:  normal mucosa   pH: n/a   Cervix: large ectropion, bleeds with exam   Adnexa: normal adnexa   Bony Pelvis: average  System: Breast:  normal appearance, no masses or tenderness   Skin: normal coloration and turgor, no rashes    Neurologic: oriented, normal mood   Extremities: normal strength, tone, and muscle mass   HEENT extra ocular movement intact, sclera clear, anicteric, oropharynx clear, no lesions, neck supple with midline trachea, thyroid without masses and trachea midline   Mouth/Teeth mucous membranes moist, pharynx normal without lesions and dental hygiene good   Neck supple and no masses   Cardiovascular: regular rate and rhythm   Respiratory:  appears well, vitals normal, no respiratory distress, acyanotic, normal RR, chest clear, no  wheezing, crepitations, rhonchi, normal symmetric air entry   Abdomen: soft, non-tender; bowel sounds normal; no masses,  no organomegaly   Urinary: urethral meatus normal      Assessment:    Pregnancy: G1P0000 Patient Active Problem List   Diagnosis Date Noted  . Supervision of normal pregnancy 07/18/2016  . Cough 05/11/2015  . Multiple food allergies 05/11/2015  . Environmental allergies 05/11/2015  . Seasonal allergies 08/04/2014  . BPV (benign positional vertigo) 08/04/2014  . Enlarged lymph nodes 05/11/2014  . GERD (gastroesophageal reflux disease) 08/12/2013  . Hyperlipidemia 03/25/2013  . 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 Pain aggravated by eating or drinking 03/25/2013  . Back pain 04/28/2012  . CARDIAC MURMUR 08/10/2009        Plan:     Initial labs drawn. Prenatal vitamins.  1.  Anxiety around pregnancy--Might not be good candidate for low risk babyscript model.  Will reassess at next visit 2.  Discussed NIPS.  Pt will talk with husband 3.  Overweight--Pt referred to nutriton; early glucola 4.  ASA at 13 weeks for AMA 5.  Anatomy US at 18-20 weeks 6.  Cervical ectropion--pt reassured.   Silas Rich 07/18/2016

## 2016-07-19 LAB — PRENATAL PROFILE (SOLSTAS)
Antibody Screen: NEGATIVE
Basophils Absolute: 0 cells/uL (ref 0–200)
Basophils Relative: 0 %
EOS ABS: 0 {cells}/uL — AB (ref 15–500)
Eosinophils Relative: 0 %
HCT: 39 % (ref 35.0–45.0)
HEMOGLOBIN: 12.9 g/dL (ref 11.7–15.5)
HEP B S AG: NEGATIVE
HIV: NONREACTIVE
LYMPHS ABS: 1938 {cells}/uL (ref 850–3900)
LYMPHS PCT: 19 %
MCH: 28.5 pg (ref 27.0–33.0)
MCHC: 33.1 g/dL (ref 32.0–36.0)
MCV: 86.1 fL (ref 80.0–100.0)
MONO ABS: 714 {cells}/uL (ref 200–950)
MPV: 9.9 fL (ref 7.5–12.5)
Monocytes Relative: 7 %
NEUTROS PCT: 74 %
Neutro Abs: 7548 cells/uL (ref 1500–7800)
Platelets: 323 10*3/uL (ref 140–400)
RBC: 4.53 MIL/uL (ref 3.80–5.10)
RDW: 15.4 % — ABNORMAL HIGH (ref 11.0–15.0)
RH TYPE: POSITIVE
Rubella: 6.03 Index — ABNORMAL HIGH (ref <0.90)
WBC: 10.2 10*3/uL (ref 3.8–10.8)

## 2016-07-19 LAB — GLUCOSE TOLERANCE, 1 HOUR (50G) W/O FASTING: GLUCOSE, 1 HR, GESTATIONAL: 90 mg/dL (ref <140)

## 2016-07-19 LAB — URINE CYTOLOGY ANCILLARY ONLY
Chlamydia: NEGATIVE
Neisseria Gonorrhea: NEGATIVE

## 2016-07-20 LAB — CULTURE, OB URINE: Organism ID, Bacteria: NO GROWTH

## 2016-08-15 ENCOUNTER — Ambulatory Visit (INDEPENDENT_AMBULATORY_CARE_PROVIDER_SITE_OTHER): Payer: BC Managed Care – PPO | Admitting: Obstetrics & Gynecology

## 2016-08-15 VITALS — BP 127/75 | HR 91 | Wt 187.0 lb

## 2016-08-15 DIAGNOSIS — Z3402 Encounter for supervision of normal first pregnancy, second trimester: Secondary | ICD-10-CM

## 2016-08-15 DIAGNOSIS — O09522 Supervision of elderly multigravida, second trimester: Secondary | ICD-10-CM

## 2016-08-15 DIAGNOSIS — O09529 Supervision of elderly multigravida, unspecified trimester: Secondary | ICD-10-CM | POA: Insufficient documentation

## 2016-08-15 NOTE — Progress Notes (Signed)
   PRENATAL VISIT NOTE  Subjective:  Mallory Rich is a 36 y.o. G1P0000 at [redacted]w[redacted]d being seen today for ongoing prenatal care.  She is currently monitored for the following issues for this low-risk pregnancy and has CARDIAC MURMUR; Back pain; Hyperlipidemia; Gallbladder anomaly; Pain aggravated by eating or drinking; GERD (gastroesophageal reflux disease); Enlarged lymph nodes; Seasonal allergies; BPV (benign positional vertigo); Cough; Multiple food allergies; Environmental allergies; Supervision of normal pregnancy; Cervical ectropion; and AMA (advanced maternal age) multigravida 35+ on her problem list.  Patient reports no complaints.   . Vag. Bleeding: None.   . Denies leaking of fluid.   The following portions of the patient's history were reviewed and updated as appropriate: allergies, current medications, past family history, past medical history, past social history, past surgical history and problem list. Problem list updated.  Objective:   Vitals:   08/15/16 1045  BP: 127/75  Pulse: 91  Weight: 187 lb (84.8 kg)    Fetal Status:           General:  Alert, oriented and cooperative. Patient is in no acute distress.  Skin: Skin is warm and dry. No rash noted.   Cardiovascular: Normal heart rate noted  Respiratory: Normal respiratory effort, no problems with respiration noted  Abdomen: Soft, gravid, appropriate for gestational age.       Pelvic:  Cervical exam deferred        Extremities: Normal range of motion.  Edema: None  Mental Status: Normal mood and affect. Normal behavior. Normal judgment and thought content.   Assessment and Plan:  Pregnancy: G1P0000 at [redacted]w[redacted]d  1. Encounter for supervision of normal first pregnancy in second trimester   2. Elderly multigravida in second trimester - NIPS today  Preterm labor symptoms and general obstetric precautions including but not limited to vaginal bleeding, contractions, leaking of fluid and fetal movement were reviewed in  detail with the patient. Please refer to After Visit Summary for other counseling recommendations.  No Follow-up on file.   Emily Filbert, MD

## 2016-08-22 ENCOUNTER — Encounter: Payer: Self-pay | Admitting: Registered"

## 2016-08-22 ENCOUNTER — Encounter: Payer: BC Managed Care – PPO | Attending: Physician Assistant | Admitting: Registered"

## 2016-08-22 DIAGNOSIS — Z713 Dietary counseling and surveillance: Secondary | ICD-10-CM | POA: Insufficient documentation

## 2016-08-22 DIAGNOSIS — E639 Nutritional deficiency, unspecified: Secondary | ICD-10-CM | POA: Insufficient documentation

## 2016-08-22 NOTE — Patient Instructions (Addendum)
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  Look into getting the portable peanut butter for work snacks/lunch supplement.  Look into protein drinks, bars to help supplement protein in your diet.  Consider using beans in salads for added fiber and protein.  To get more vegetables in your diet try Celery w peanut butter  Roasted vegetables may give you a different taste  Dress up the tuna with other veggies & onions  Consider getting some soy into your diet  Aim to get a few servings of nuts every day.  Calcium: check your alternative nutrition facts label, greens, white beans - humus would be a good way to eat it.  Omega 3: walnuts, chia seeds, ground flax seeds up to 2 Tbl spoons/day, tuna fish, salmon.

## 2016-08-22 NOTE — Progress Notes (Signed)
Medical Nutrition Therapy:  Appt start time: 1430 end time:  5035.  Assessment:  Primary concerns today: Pt is trying to eat healthy, especially since she is pregnant, but feels it is hard to have variety of meals with limitations from her allergies and husbands GERD and food intolerances. Pt wants ideas to have besides her [place], chicken, rice. Pt states that since she doesn't get much dairy in her diet she would like to know if she is eating enough calcium.  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 Pt reports being very tired afterwork and is not exercising now, but plans to start a regular walking routine once it is summertime.  Preferred Learning Style:   No preference indicated   Learning Readiness:   Ready  MEDICATIONS: reviewed  DIETARY INTAKE:  Usual eating pattern includes 3 meals and 2-3 snacks per day.  Avoided foods include Food allergies according to a blood test include: milk, eggs, wheat, beef. Pt reported symptoms include sinus & headache reactions.   24-hr recall:  B (9-10:30 AM): milk based yogurt OR coconut yogurt OR overnight oats Snk ( AM): none when working (snacks constantly when home) candy  L ( PM): tuna fish with mayo without egg OR fast food OR caffertia at school, chips Snk ( PM): none OR if dinner late: fast food, ice cream, apple slices  D ( PM): chicken or [place], rice, veggies (husband's veggie preference limited to zucchini, Brussels sprouts, broccoli, cauliflower, carrots, greens beans.) Snk ( PM): if awake maybe fruit or candy Beverages: water, occasionally herbal tea.  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  Estimated energy needs: 2400 calories 270 g carbohydrates 180 g protein 67 g fat  Progress Towards Goal(s):  In progress.   Nutritional Diagnosis:  NI-5.8.5 Inadeqate fiber intake As related to limited fiber rich foods.  As evidenced by diet recall and pt reported lack of fiber rich foods in  diet..    Intervention:  Nutrition Education. Discussed sources of fiber, protein and calcium in diet to compensate for foods avoided due to allergies. Discussed importance of exercise.   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  Look into getting the portable peanut butter for work snacks/lunch supplement.  Look into protein drinks, bars to help supplement protein in your diet.  Consider using beans in salads for added fiber and protein.  To get more vegetables in your diet try celery w peanut butter  Roasted vegetables may give you a different taste  Dress up the tuna with other veggies & onions  Consider getting some soy into your diet  Aim to get a few servings of nuts every day.  Calcium: check your alternative nutrition facts label, greens, white beans - humus would be a good way to eat it.  Omega 3: walnuts, chia seeds, ground flax seeds up to 2 Tbl spoons/day, tuna fish, salmon.  Teaching Method Utilized:  Visual Auditory   Handouts given during visit include:  none  Barriers to learning/adherence to lifestyle change: none  Demonstrated degree of understanding via:  Teach Back   Monitoring/Evaluation:  Dietary intake, exercise, and body weight prn.

## 2016-09-03 ENCOUNTER — Encounter: Payer: Self-pay | Admitting: *Deleted

## 2016-09-03 DIAGNOSIS — Z3402 Encounter for supervision of normal first pregnancy, second trimester: Secondary | ICD-10-CM

## 2016-09-03 NOTE — Addendum Note (Signed)
Addended by: Asencion Islam on: 09/03/2016 03:53 PM   Modules accepted: Orders

## 2016-09-26 ENCOUNTER — Ambulatory Visit (INDEPENDENT_AMBULATORY_CARE_PROVIDER_SITE_OTHER): Payer: BC Managed Care – PPO | Admitting: Obstetrics & Gynecology

## 2016-09-26 VITALS — BP 122/68 | HR 83 | Wt 194.0 lb

## 2016-09-26 DIAGNOSIS — Z3402 Encounter for supervision of normal first pregnancy, second trimester: Secondary | ICD-10-CM

## 2016-09-26 DIAGNOSIS — O09522 Supervision of elderly multigravida, second trimester: Secondary | ICD-10-CM

## 2016-09-26 NOTE — Progress Notes (Signed)
   PRENATAL VISIT NOTE  Subjective:  Mallory Rich is a 36 y.o. G1P0000 at [redacted]w[redacted]d being seen today for ongoing prenatal care.  She is currently monitored for the following issues for this low-risk pregnancy and has CARDIAC MURMUR; Back pain; Hyperlipidemia; Gallbladder anomaly; Pain aggravated by eating or drinking; GERD (gastroesophageal reflux disease); Enlarged lymph nodes; Seasonal allergies; BPV (benign positional vertigo); Cough; Multiple food allergies; Environmental allergies; Supervision of normal pregnancy; Cervical ectropion; and AMA (advanced maternal age) multigravida 35+ on her problem list.  Patient reports no complaints.  Contractions: Not present. Vag. Bleeding: None.  Movement: Present. Denies leaking of fluid.   The following portions of the patient's history were reviewed and updated as appropriate: allergies, current medications, past family history, past medical history, past social history, past surgical history and problem list. Problem list updated.  Objective:   Vitals:   09/26/16 1105  BP: 122/68  Pulse: 83  Weight: 194 lb (88 kg)    Fetal Status:     Movement: Present     General:  Alert, oriented and cooperative. Patient is in no acute distress.  Skin: Skin is warm and dry. No rash noted.   Cardiovascular: Normal heart rate noted  Respiratory: Normal respiratory effort, no problems with respiration noted  Abdomen: Soft, gravid, appropriate for gestational age. Pain/Pressure: Absent     Pelvic:  Cervical exam deferred        Extremities: Normal range of motion.  Edema: None  Mental Status: Normal mood and affect. Normal behavior. Normal judgment and thought content.   Assessment and Plan:  Pregnancy: G1P0000 at [redacted]w[redacted]d  1. Encounter for supervision of normal first pregnancy in second trimester   2. Elderly multigravida in second trimester - Normal NIPs 3. She just realized that we don't deliver at [facility]. 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  Preterm labor symptoms and general obstetric precautions including but not limited to vaginal bleeding, contractions, leaking of fluid and fetal movement were reviewed in detail with the patient. Please refer to After Visit Summary for other counseling recommendations.  No Follow-up on file.   Emily Filbert, MD

## 2016-10-02 ENCOUNTER — Other Ambulatory Visit: Payer: Self-pay | Admitting: Obstetrics & Gynecology

## 2016-10-02 ENCOUNTER — Ambulatory Visit (HOSPITAL_COMMUNITY)
Admission: RE | Admit: 2016-10-02 | Discharge: 2016-10-02 | Disposition: A | Discharge status: Home / Self Care | Payer: BC Managed Care – PPO | Source: Ambulatory Visit | Attending: Obstetrics & Gynecology | Admitting: Obstetrics & Gynecology

## 2016-10-02 DIAGNOSIS — Z3402 Encounter for supervision of normal first pregnancy, second trimester: Secondary | ICD-10-CM

## 2016-10-02 DIAGNOSIS — O09512 Supervision of elderly primigravida, second trimester: Secondary | ICD-10-CM | POA: Insufficient documentation

## 2016-10-02 DIAGNOSIS — Z3A20 20 weeks gestation of pregnancy: Secondary | ICD-10-CM

## 2016-10-02 DIAGNOSIS — Z363 Encounter for antenatal screening for malformations: Secondary | ICD-10-CM | POA: Diagnosis not present

## 2016-10-24 ENCOUNTER — Ambulatory Visit (INDEPENDENT_AMBULATORY_CARE_PROVIDER_SITE_OTHER): Payer: BC Managed Care – PPO | Admitting: Obstetrics & Gynecology

## 2016-10-24 VITALS — BP 121/74 | HR 79 | Wt 198.0 lb

## 2016-10-24 DIAGNOSIS — Z3402 Encounter for supervision of normal first pregnancy, second trimester: Secondary | ICD-10-CM | POA: Diagnosis not present

## 2016-10-24 NOTE — Patient Instructions (Addendum)
Return to clinic for any scheduled appointments or obstetric concerns, or go to [facility]U for evaluation  Contraception Choices Contraception (birth control) is the use of any methods or devices to prevent pregnancy. Below are some methods to help avoid pregnancy. Hormonal methods  Contraceptive implant. This is a thin, plastic tube containing progesterone hormone. It does not contain estrogen hormone. Your health care provider inserts the tube in the inner part of the upper arm. The tube can remain in place for up to 3 years. After 3 years, the implant must be removed. The implant prevents the ovaries from releasing an egg (ovulation), thickens the cervical mucus to prevent sperm from entering the uterus, and thins the lining of the inside of the uterus.  Progesterone-only injections. These injections are given every 3 months by your health care provider to prevent pregnancy. This synthetic progesterone hormone stops the ovaries from releasing eggs. It also thickens cervical mucus and changes the uterine lining. This makes it harder for sperm to survive in the uterus.  Birth control pills. These pills contain estrogen and progesterone hormone. They work by preventing the ovaries from releasing eggs (ovulation). They also cause the cervical mucus to thicken, preventing the sperm from entering the uterus. Birth control pills are prescribed by a health care provider.Birth control pills can also be used to treat heavy periods.  Minipill. This type of birth control pill contains only the progesterone hormone. They are taken every day of each month and must be prescribed by your health care provider.  Birth control patch. The patch contains hormones similar to those in birth control pills. It must be changed once a week and is prescribed by a health care provider.  Vaginal ring. The ring contains hormones similar to those in birth control pills. It is left in the vagina for 3 weeks, removed for 1 week, and  then a new one is put back in place. The patient must be comfortable inserting and removing the ring from the vagina.A health care provider's prescription is necessary.  Emergency contraception. Emergency contraceptives prevent pregnancy after unprotected sexual intercourse. This pill can be taken right after sex or up to 5 days after unprotected sex. It is most effective the sooner you take the pills after having sexual intercourse. Most emergency contraceptive pills are available without a prescription. Check with your pharmacist. Do not use emergency contraception as your only form of birth control. Barrier methods  Female condom. This is a thin sheath (latex or rubber) that is worn over the penis during sexual intercourse. It can be used with spermicide to increase effectiveness.  Female condom. This is a soft, loose-fitting sheath that is put into the vagina before sexual intercourse.  Diaphragm. This is a soft, latex, dome-shaped barrier that must be fitted by a health care provider. It is inserted into the vagina, along with a spermicidal jelly. It is inserted before intercourse. The diaphragm should be left in the vagina for 6 to 8 hours after intercourse.  Cervical cap. This is a round, soft, latex or plastic cup that fits over the cervix and must be fitted by a health care provider. The cap can be left in place for up to 48 hours after intercourse.  Sponge. This is a soft, circular piece of polyurethane foam. The sponge has spermicide in it. It is inserted into the vagina after wetting it and before sexual intercourse.  Spermicides. These are chemicals that kill or block sperm from entering the cervix and uterus. They come  in the form of creams, jellies, suppositories, foam, or tablets. They do not require a prescription. They are inserted into the vagina with an applicator before having sexual intercourse. The process must be repeated every time you have sexual intercourse. Intrauterine  contraception  Intrauterine device (IUD). This is a T-shaped device that is put in a woman's uterus during a menstrual period to prevent pregnancy. There are 2 types: ? Copper IUD. This type of IUD is wrapped in copper wire and is placed inside the uterus. Copper makes the uterus and fallopian tubes produce a fluid that kills sperm. It can stay in place for 10 years. ? Hormone IUD. This type of IUD contains the hormone progestin (synthetic progesterone). The hormone thickens the cervical mucus and prevents sperm from entering the uterus, and it also thins the uterine lining to prevent implantation of a fertilized egg. The hormone can weaken or kill the sperm that get into the uterus. It can stay in place for 3-5 years, depending on which type of IUD is used. Permanent methods of contraception  Female tubal ligation. This is when the woman's fallopian tubes are surgically sealed, tied, or blocked to prevent the egg from traveling to the uterus.  Hysteroscopic sterilization. This involves placing a small coil or insert into each fallopian tube. Your doctor uses a technique called hysteroscopy to do the procedure. The device causes scar tissue to form. This results in permanent blockage of the fallopian tubes, so the sperm cannot fertilize the egg. It takes about 3 months after the procedure for the tubes to become blocked. You must use another form of birth control for these 3 months.  Female sterilization. This is when the female has the tubes that carry sperm tied off (vasectomy).This blocks sperm from entering the vagina during sexual intercourse. After the procedure, the man can still ejaculate fluid (semen). Natural planning methods  Natural family planning. This is not having sexual intercourse or using a barrier method (condom, diaphragm, cervical cap) on days the woman could become pregnant.  Calendar method. This is keeping track of the length of each menstrual cycle and identifying when you are  fertile.  Ovulation method. This is avoiding sexual intercourse during ovulation.  Symptothermal method. This is avoiding sexual intercourse during ovulation, using a thermometer and ovulation symptoms.  Post-ovulation method. This is timing sexual intercourse after you have ovulated. Regardless of which type or method of contraception you choose, it is important that you use condoms to protect against the transmission of sexually transmitted infections (STIs). Talk with your health care provider about which form of contraception is most appropriate for you. This information is not intended to replace advice given to you by your health care provider. Make sure you discuss any questions you have with your health care provider. Document Released: 04/02/2005 Document Revised: 09/08/2015 Document Reviewed: 09/25/2012 Elsevier Interactive Patient Education  2017 [employer].    Intrauterine Device Information An intrauterine device (IUD) is inserted into your uterus to prevent pregnancy. There are two types of IUDs available:  Copper IUD-This type of IUD is wrapped in copper wire and is placed inside the uterus. Copper makes the uterus and fallopian tubes produce a fluid that kills sperm. The copper IUD can stay in place for 10 years.  Hormone IUD-This type of IUD contains the hormone progestin (synthetic progesterone). The hormone thickens the cervical mucus and prevents sperm from entering the uterus. It also thins the uterine lining to prevent implantation of a fertilized egg. The  hormone can weaken or kill the sperm that get into the uterus. One type of hormone IUD can stay in place for 5 years, and another type can stay in place for 3 years.  Your health care provider will make sure you are a good candidate for a contraceptive IUD. Discuss with your health care provider the possible side effects. Advantages of an intrauterine device  IUDs are highly effective, reversible, long acting, and  low maintenance.  There are no estrogen-related side effects.  An IUD can be used when breastfeeding.  IUDs are not associated with weight gain.  The copper IUD works immediately after insertion.  The hormone IUD works right away if inserted within 7 days of your period starting. You will need to use a backup method of birth control for 7 days if the hormone IUD is inserted at any other time in your cycle.  The copper IUD does not interfere with your female hormones.  The hormone IUD can make heavy menstrual periods lighter and decrease cramping.  The hormone IUD can be used for 3 or 5 years.  The copper IUD can be used for 10 years. Disadvantages of an intrauterine device  The hormone IUD can be associated with irregular bleeding patterns.  The copper IUD can make your menstrual flow heavier and more painful.  You may experience cramping and vaginal bleeding after insertion. This information is not intended to replace advice given to you by your health care provider. Make sure you discuss any questions you have with your health care provider. Document Released: 03/06/2004 Document Revised: 09/08/2015 Document Reviewed: 09/21/2012 Elsevier Interactive Patient Education  2017 [employer].

## 2016-10-24 NOTE — Progress Notes (Signed)
   PRENATAL VISIT NOTE  Subjective:  Mallory Rich is a 36 y.o. G1P0000 at [redacted]w[redacted]d being seen today for ongoing prenatal care.  She is currently monitored for the following issues for this low-risk pregnancy and has CARDIAC MURMUR; Back pain; Hyperlipidemia; Gallbladder anomaly; Pain aggravated by eating or drinking; GERD (gastroesophageal reflux disease); Enlarged lymph nodes; Seasonal allergies; BPV (benign positional vertigo); Cough; Multiple food allergies; Environmental allergies; Supervision of normal pregnancy; Cervical ectropion; and AMA (advanced maternal age) multigravida 35+ on her problem list.  Patient reports no complaints.  Contractions: Not present. Vag. Bleeding: None.  Movement: Present. Denies leaking of fluid.   The following portions of the patient's history were reviewed and updated as appropriate: allergies, current medications, past family history, past medical history, past social history, past surgical history and problem list. Problem list updated.  Objective:   Vitals:   10/24/16 1111  BP: 121/74  Pulse: 79  Weight: 198 lb (89.8 kg)    Fetal Status: Fetal Heart Rate (bpm): 154 Fundal Height: 23 cm Movement: Present     General:  Alert, oriented and cooperative. Patient is in no acute distress.  Skin: Skin is warm and dry. No rash noted.   Cardiovascular: Normal heart rate noted  Respiratory: Normal respiratory effort, no problems with respiration noted  Abdomen: Soft, gravid, appropriate for gestational age. Pain/Pressure: Absent     Pelvic:  Cervical exam deferred        Extremities: Normal range of motion.  Edema: Trace  Mental Status: Normal mood and affect. Normal behavior. Normal judgment and thought content.   Assessment and Plan:  Pregnancy: G1P0000 at [redacted]w[redacted]d  1. Encounter for supervision of normal first pregnancy in second trimester Preterm labor symptoms and general obstetric precautions including but not limited to vaginal bleeding, contractions,  leaking of fluid and fetal movement were reviewed in detail with the patient. Please refer to After Visit Summary for other counseling recommendations.  Return in about 4 weeks (around 11/21/2016) for 2 hr GTT, 3rd trimester labs, TDap, OB Visit.   Verita Schneiders, MD

## 2016-11-05 ENCOUNTER — Inpatient Hospital Stay (HOSPITAL_COMMUNITY)
Admission: AD | Admit: 2016-11-05 | Discharge: 2016-11-05 | Disposition: A | Discharge status: Home / Self Care | Payer: BC Managed Care – PPO | Source: Ambulatory Visit | Attending: Family Medicine | Admitting: Family Medicine

## 2016-11-05 ENCOUNTER — Encounter (HOSPITAL_COMMUNITY): Payer: Self-pay | Admitting: *Deleted

## 2016-11-05 DIAGNOSIS — O9989 Other specified diseases and conditions complicating pregnancy, childbirth and the puerperium: Secondary | ICD-10-CM

## 2016-11-05 DIAGNOSIS — R109 Unspecified abdominal pain: Secondary | ICD-10-CM

## 2016-11-05 DIAGNOSIS — R141 Gas pain: Secondary | ICD-10-CM | POA: Insufficient documentation

## 2016-11-05 DIAGNOSIS — Z3A25 25 weeks gestation of pregnancy: Secondary | ICD-10-CM | POA: Diagnosis not present

## 2016-11-05 DIAGNOSIS — O368120 Decreased fetal movements, second trimester, not applicable or unspecified: Secondary | ICD-10-CM | POA: Diagnosis present

## 2016-11-05 DIAGNOSIS — O26892 Other specified pregnancy related conditions, second trimester: Secondary | ICD-10-CM | POA: Diagnosis not present

## 2016-11-05 DIAGNOSIS — Z3689 Encounter for other specified antenatal screening: Secondary | ICD-10-CM

## 2016-11-05 LAB — URINALYSIS, ROUTINE W REFLEX MICROSCOPIC
BILIRUBIN URINE: NEGATIVE
GLUCOSE, UA: NEGATIVE mg/dL
HGB URINE DIPSTICK: NEGATIVE
KETONES UR: NEGATIVE mg/dL
Leukocytes, UA: NEGATIVE
Nitrite: NEGATIVE
PROTEIN: NEGATIVE mg/dL
Specific Gravity, Urine: 1.008 (ref 1.005–1.030)
pH: 6 (ref 5.0–8.0)

## 2016-11-05 NOTE — MAU Provider Note (Signed)
History     CSN: [id number]  Arrival date and time: 11/05/16 1429   First Provider Initiated Contact with Patient 11/05/16 1518      Chief Complaint  Patient presents with  . Abdominal Pain  . Decreased Fetal Movement   HPI Ms. Mallory Rich is a 36 y.o. G1P0000 at [redacted]w[redacted]d who presents to [facility]U today with complaint of abdominal pain and decreased fetal movement.   Tightness for a few days No contractions  No bleeding, LOF Less movement since Friday  Since arrival - normal FM No change in diet More gas   OB History    Gravida Para Term Preterm AB Living   1 0 0 0 0 0   SAB TAB Ectopic Multiple Live Births   0 0 0 0        Past Medical History:  Diagnosis Date  . Allergy   . CARDIAC MURMUR 08/10/2009   Qualifier: Diagnosis of  By: Stanford Breed, MD, Kandyce Rud   . CHEST PAIN 08/10/2009   Qualifier: Diagnosis of  By: Stanford Breed, MD, Kandyce Rud     Past Surgical History:  Procedure Laterality Date  . wisdom teeth removal      Family History  Problem Relation Age of Onset  . Stroke Paternal Grandmother   . Melanoma Maternal Grandfather   . Stroke Maternal Grandmother     Social History  Substance Use Topics  . Smoking status: Never Smoker  . Smokeless tobacco: Never Used  . Alcohol use No    Allergies:  Allergies  Allergen Reactions  . Clindamycin/Lincomycin     heartburn  . Doxycycline Nausea And Vomiting  . Eggs Or Egg-Derived Products     No over symptoms. Dr. Tamera Reason did a blood test and egg was detected as an allergen  . Tramadol     Nausea and vomiting    Prescriptions Prior to Admission  Medication Sig Dispense Refill Last Dose  . cetirizine (ZYRTEC) 10 MG tablet Take 10 mg by mouth daily.   Taking  . montelukast (SINGULAIR) 10 MG tablet TAKE 1 TABLET(10 MG) BY MOUTH AT BEDTIME 90 tablet 0 Taking  . Prenatal Vit-Fe Fumarate-FA (MULTIVITAMIN-PRENATAL) 27-0.8 MG TABS tablet Take 1 tablet by mouth daily at 12 noon.   Taking  .  ranitidine (ZANTAC) 150 MG tablet Take 1 tablet (150 mg total) by mouth 2 (two) times daily. 60 tablet 5 Taking    Review of Systems  Constitutional: Negative for fever.  Gastrointestinal: Negative for abdominal pain, constipation, diarrhea, nausea and vomiting.  Genitourinary: Negative for vaginal bleeding.   Physical Exam   Blood pressure 119/69, pulse 91, temperature 97.8 F (36.6 C), temperature source Oral, resp. rate 18, last menstrual period 05/12/2016.  Physical Exam  Nursing note and vitals reviewed. Constitutional: She is oriented to person, place, and time. She appears well-developed and well-nourished. No distress.  HENT:  Head: Normocephalic and atraumatic.  Cardiovascular: Normal rate.   Respiratory: Effort normal.  GI: Soft. She exhibits no distension and no mass. There is no tenderness. There is no rebound and no guarding.  Neurological: She is alert and oriented to person, place, and time.  Skin: Skin is warm and dry. No erythema.  Psychiatric: She has a normal mood and affect.   Fetal Monitoring: Baseline: 145 bpm Variability: moderate Accelerations:  10 x 10 Decelerations: none Contractions: none   MAU Course  Procedures  None  MDM UA today  NST reassuring for GA, patient reassured of normal  FM while in MAU today.   Assessment and Plan  A: SIUP at [redacted]w[redacted]d Gas pains NST reassuring for GA  P:  Discharge home OTC recommendations discussed  Preterm labor precautions discussed Patient advised to follow-up with [facility] as scheduled for routine prenatal care Patient may return to [facility]U as needed or if her condition were to change or worsen  Kerry Hough, PA-C 11/05/2016, 3:50 PM

## 2016-11-05 NOTE — Discharge Instructions (Signed)
Abdominal Bloating When you have abdominal bloating, your abdomen may feel full, tight, or painful. It may also look bigger than normal or swollen (distended). Common causes of abdominal bloating include:  Swallowing air.  Constipation.  Problems digesting food.  Eating too much.  Irritable bowel syndrome. This is a condition that affects the large intestine.  Lactose intolerance. This is an inability to digest lactose, a natural sugar in dairy products.  Celiac disease. This is a condition that affects the ability to digest gluten, a protein found in some grains.  Gastroparesis. This is a condition that slows down the movement of food in the stomach and small intestine. It is more common in people with diabetes mellitus.  Gastroesophageal reflux disease (GERD). This is a digestive condition that makes stomach acid flow back into the esophagus.  Urinary retention. This means that the body is holding onto urine, and the bladder cannot be emptied all the way.  Follow these instructions at home: Eating and drinking  Avoid eating too much.  Try not to swallow air while talking or eating.  Avoid eating while lying down.  Avoid these foods and drinks: ? Foods that cause gas, such as broccoli, cabbage, cauliflower, and baked beans. ? Carbonated drinks. ? Hard candy. ? Chewing gum. Medicines  Take over-the-counter and prescription medicines only as told by your health care provider.  Take probiotic medicines. These medicines contain live bacteria or yeasts that can help digestion.  Take coated peppermint oil capsules. Activity  Try to exercise regularly. Exercise may help to relieve bloating that is caused by gas and relieve constipation. General instructions  Keep all follow-up visits as told by your health care provider. This is important. Contact a health care provider if:  You have nausea and vomiting.  You have diarrhea.  You have abdominal pain.  You have  unusual weight loss or weight gain.  You have severe pain, and medicines do not help. Get help right away if:  You have severe chest pain.  You have trouble breathing.  You have shortness of breath.  You have trouble urinating.  You have darker urine than normal.  You have blood in your stools or have dark, tarry stools. Summary  Abdominal bloating means that the abdomen is swollen.  Common causes of abdominal bloating are swallowing air, constipation, and problems digesting food.  Avoid eating too much and avoid swallowing air.  Avoid foods that cause gas, carbonated drinks, hard candy, and chewing gum. This information is not intended to replace advice given to you by your health care provider. Make sure you discuss any questions you have with your health care provider. Document Released: 05/04/2016 Document Revised: 05/04/2016 Document Reviewed: 05/04/2016 Elsevier Interactive Patient Education  2018 [employer]. Fetal Movement Counts Patient Name: ________________________________________________ Patient Due Date: ____________________ What is a fetal movement count? A fetal movement count is the number of times that you feel your baby move during a certain amount of time. This may also be called a fetal kick count. A fetal movement count is recommended for every pregnant woman. You may be asked to start counting fetal movements as early as week 28 of your pregnancy. Pay attention to when your baby is most active. You may notice your baby's sleep and wake cycles. You may also notice things that make your baby move more. You should do a fetal movement count:  When your baby is normally most active.  At the same time each day.  A good time  to count movements is while you are resting, after having something to eat and drink. How do I count fetal movements? 1. Find a quiet, comfortable area. Sit, or lie down on your side. 2. Write down the date, the start time and stop time,  and the number of movements that you felt between those two times. Take this information with you to your health care visits. 3. For 2 hours, count kicks, flutters, swishes, rolls, and jabs. You should feel at least 10 movements during 2 hours. 4. You may stop counting after you have felt 10 movements. 5. If you do not feel 10 movements in 2 hours, have something to eat and drink. Then, keep resting and counting for 1 hour. If you feel at least 4 movements during that hour, you may stop counting. Contact a health care provider if:  You feel fewer than 4 movements in 2 hours.  Your baby is not moving like he or she usually does. Date: ____________ Start time: ____________ Stop time: ____________ Movements: ____________ Date: ____________ Start time: ____________ Stop time: ____________ Movements: ____________ Date: ____________ Start time: ____________ Stop time: ____________ Movements: ____________ Date: ____________ Start time: ____________ Stop time: ____________ Movements: ____________ Date: ____________ Start time: ____________ Stop time: ____________ Movements: ____________ Date: ____________ Start time: ____________ Stop time: ____________ Movements: ____________ Date: ____________ Start time: ____________ Stop time: ____________ Movements: ____________ Date: ____________ Start time: ____________ Stop time: ____________ Movements: ____________ Date: ____________ Start time: ____________ Stop time: ____________ Movements: ____________ This information is not intended to replace advice given to you by your health care provider. Make sure you discuss any questions you have with your health care provider. Document Released: 05/02/2006 Document Revised: 11/30/2015 Document Reviewed: 05/12/2015 Elsevier Interactive Patient Education  2018 [employer]. [employer] of the uterus can occur throughout pregnancy, but they are not always a sign that you are in labor. You  may have practice contractions called Braxton Hicks contractions. These false labor contractions are sometimes confused with true labor. What are [place] contractions? Braxton Hicks contractions are tightening movements that occur in the muscles of the uterus before labor. Unlike true labor contractions, these contractions do not result in opening (dilation) and thinning of the cervix. Toward the end of pregnancy (32-34 weeks), Braxton Hicks contractions can happen more often and may become stronger. These contractions are sometimes difficult to tell apart from true labor because they can be very uncomfortable. You should not feel embarrassed if you go to the hospital with false labor. Sometimes, the only way to tell if you are in true labor is for your health care provider to look for changes in the cervix. The health care provider will do a physical exam and may monitor your contractions. If you are not in true labor, the exam should show that your cervix is not dilating and your water has not broken. If there are no prenatal problems or other health problems associated with your pregnancy, it is completely safe for you to be sent home with false labor. You may continue to have Braxton Hicks contractions until you go into true labor. How can I tell the difference between true labor and false labor?  Differences ? False labor ? Contractions last 30-70 seconds.: Contractions are usually shorter and not as strong as true labor contractions. ? Contractions become very regular.: Contractions are usually irregular. ? Discomfort is usually felt in the top of the uterus, and it spreads to the lower abdomen and low back.:  Contractions are often felt in the front of the lower abdomen and in the groin. ? Contractions do not go away with walking.: Contractions may go away when you walk around or change positions while lying down. ? Contractions usually become more intense and increase in frequency.:  Contractions get weaker and are shorter-lasting as time goes on. ? The cervix dilates and gets thinner.: The cervix usually does not dilate or become thin. Follow these instructions at home:  Take over-the-counter and prescription medicines only as told by your health care provider.  Keep up with your usual exercises and follow other instructions from your health care provider.  Eat and drink lightly if you think you are going into labor.  If Braxton Hicks contractions are making you uncomfortable: ? Change your position from lying down or resting to walking, or change from walking to resting. ? Sit and rest in a tub of warm water. ? Drink enough fluid to keep your urine clear or pale yellow. Dehydration may cause these contractions. ? Do slow and deep breathing several times an hour.  Keep all follow-up prenatal visits as told by your health care provider. This is important. Contact a health care provider if:  You have a fever.  You have continuous pain in your abdomen. Get help right away if:  Your contractions become stronger, more regular, and closer together.  You have fluid leaking or gushing from your vagina.  You pass blood-tinged mucus (bloody show).  You have bleeding from your vagina.  You have low back pain that you never had before.  You feel your babys head pushing down and causing pelvic pressure.  Your baby is not moving inside you as much as it used to. Summary  Contractions that occur before labor are called Braxton Hicks contractions, false labor, or practice contractions.  Braxton Hicks contractions are usually shorter, weaker, farther apart, and less regular than true labor contractions. True labor contractions usually become progressively stronger and regular and they become more frequent.  Manage discomfort from [facility] contractions by changing position, resting in a warm bath, drinking plenty of water, or practicing deep breathing. This  information is not intended to replace advice given to you by your health care provider. Make sure you discuss any questions you have with your health care provider. Document Released: 04/02/2005 Document Revised: 02/20/2016 Document Reviewed: 02/20/2016 Elsevier Interactive Patient Education  2017 [employer].

## 2016-11-05 NOTE — MAU Note (Signed)
Pt has felt abd tightening after a meal on Friday, has not stopped since then.  Has had decreased FM today.  FM is normal only after drinking & lying on her L side.  Baby is not as "boisterous."  Denies bleeding or LOF.

## 2016-11-23 ENCOUNTER — Encounter: Payer: BC Managed Care – PPO | Admitting: Obstetrics and Gynecology

## 2017-05-11 ENCOUNTER — Encounter (HOSPITAL_COMMUNITY): Payer: Self-pay

## 2018-02-21 ENCOUNTER — Ambulatory Visit (INDEPENDENT_AMBULATORY_CARE_PROVIDER_SITE_OTHER): Payer: BLUE CROSS/BLUE SHIELD | Admitting: Family Medicine

## 2018-02-21 DIAGNOSIS — Z23 Encounter for immunization: Secondary | ICD-10-CM | POA: Diagnosis not present
